# Patient Record
Sex: Female | Born: 1975 | Race: White | Hispanic: No | Marital: Married | Smoking: Former smoker
Health system: Southern US, Community
[De-identification: ages and names within clinical notes are randomized; demographics above are authoritative.]

## PROBLEM LIST (undated history)

## (undated) DIAGNOSIS — Z87442 Personal history of urinary calculi: Secondary | ICD-10-CM

## (undated) DIAGNOSIS — O09299 Supervision of pregnancy with other poor reproductive or obstetric history, unspecified trimester: Secondary | ICD-10-CM

## (undated) DIAGNOSIS — E039 Hypothyroidism, unspecified: Secondary | ICD-10-CM

## (undated) DIAGNOSIS — K219 Gastro-esophageal reflux disease without esophagitis: Secondary | ICD-10-CM

## (undated) DIAGNOSIS — D649 Anemia, unspecified: Secondary | ICD-10-CM

## (undated) DIAGNOSIS — R519 Headache, unspecified: Secondary | ICD-10-CM

## (undated) DIAGNOSIS — R51 Headache: Secondary | ICD-10-CM

## (undated) HISTORY — PX: WISDOM TOOTH EXTRACTION: SHX21

## (undated) HISTORY — DX: Supervision of pregnancy with other poor reproductive or obstetric history, unspecified trimester: O09.299

---

## 2002-01-09 ENCOUNTER — Encounter: Admission: RE | Admit: 2002-01-09 | Discharge: 2002-01-09 | Payer: Self-pay | Admitting: Internal Medicine

## 2002-01-09 ENCOUNTER — Encounter: Payer: Self-pay | Admitting: Internal Medicine

## 2005-01-01 ENCOUNTER — Encounter: Admission: RE | Admit: 2005-01-01 | Discharge: 2005-01-01 | Payer: Self-pay | Admitting: Internal Medicine

## 2010-04-01 ENCOUNTER — Ambulatory Visit (HOSPITAL_COMMUNITY)
Admission: RE | Admit: 2010-04-01 | Discharge: 2010-04-01 | Payer: Self-pay | Source: Home / Self Care | Admitting: Gynecology

## 2011-04-28 NOTE — L&D Delivery Note (Signed)
Delivery Note At 1:21 PM a viable female was delivered via OA Presentation APGAR: 8 and 9 Mild Shoulder dystocia noted but resolved with application of suprapubic pressure. Placenta status: spontaneously with 3 vessel cord  Cord:  with the following complications: none  Anesthesia: Epidural  Episiotomy: none Lacerations: second Suture Repair: 3.0 chromic Est. Blood Loss (mL): 300 Mom to postpartum.  Baby to nursery-stable.  Makenzie Vittorio L 03/09/2012, 1:35 PM

## 2011-08-26 LAB — OB RESULTS CONSOLE HEPATITIS B SURFACE ANTIGEN: Hepatitis B Surface Ag: NEGATIVE

## 2011-08-26 LAB — OB RESULTS CONSOLE HIV ANTIBODY (ROUTINE TESTING): HIV: NONREACTIVE

## 2011-08-26 LAB — OB RESULTS CONSOLE ABO/RH: RH Type: NEGATIVE

## 2011-08-26 LAB — OB RESULTS CONSOLE RPR: RPR: NONREACTIVE

## 2011-08-26 LAB — OB RESULTS CONSOLE RUBELLA ANTIBODY, IGM: Rubella: IMMUNE

## 2011-08-26 LAB — OB RESULTS CONSOLE ANTIBODY SCREEN: Antibody Screen: NEGATIVE

## 2012-02-17 LAB — OB RESULTS CONSOLE GBS: GBS: NEGATIVE

## 2012-03-08 ENCOUNTER — Inpatient Hospital Stay (HOSPITAL_COMMUNITY)
Admission: AD | Admit: 2012-03-08 | Discharge: 2012-03-11 | DRG: 774 | Disposition: A | Discharge status: Home / Self Care | Payer: 59 | Source: Ambulatory Visit | Attending: Obstetrics and Gynecology | Admitting: Obstetrics and Gynecology

## 2012-03-08 ENCOUNTER — Encounter (HOSPITAL_COMMUNITY): Payer: Self-pay

## 2012-03-08 DIAGNOSIS — IMO0002 Reserved for concepts with insufficient information to code with codable children: Secondary | ICD-10-CM | POA: Diagnosis present

## 2012-03-08 DIAGNOSIS — O09519 Supervision of elderly primigravida, unspecified trimester: Secondary | ICD-10-CM | POA: Diagnosis present

## 2012-03-08 HISTORY — DX: Hypothyroidism, unspecified: E03.9

## 2012-03-08 LAB — COMPREHENSIVE METABOLIC PANEL
ALT: 13 U/L (ref 0–35)
AST: 20 U/L (ref 0–37)
Alkaline Phosphatase: 159 U/L — ABNORMAL HIGH (ref 39–117)
CO2: 18 mEq/L — ABNORMAL LOW (ref 19–32)
Calcium: 9.1 mg/dL (ref 8.4–10.5)
Chloride: 102 mEq/L (ref 96–112)
GFR calc Af Amer: >90 mL/min (ref >90)
GFR calc non Af Amer: >90 mL/min (ref >90)
Glucose, Bld: 66 mg/dL — ABNORMAL LOW (ref 70–99)
Sodium: 133 mEq/L — ABNORMAL LOW (ref 135–145)
Total Bilirubin: 0.2 mg/dL — ABNORMAL LOW (ref 0.3–1.2)

## 2012-03-08 LAB — CBC
Hemoglobin: 12.6 g/dL (ref 12.0–15.0)
MCH: 30.4 pg (ref 26.0–34.0)
Platelets: 208 10*3/uL (ref 150–400)
RBC: 4.15 MIL/uL (ref 3.87–5.11)
WBC: 9.3 10*3/uL (ref 4.0–10.5)

## 2012-03-08 LAB — TYPE AND SCREEN: ABO/RH(D): A POS

## 2012-03-08 MED ORDER — OXYTOCIN BOLUS FROM INFUSION: 500.0000 mL | INTRAVENOUS | Status: DC

## 2012-03-08 MED ORDER — IBUPROFEN 600 MG PO TABS
600.0000 mg | ORAL_TABLET | Freq: Four times a day (QID) | ORAL | Status: DC | PRN
  Administered 2012-03-09: 600 mg via ORAL
  Filled 2012-03-08: qty 1

## 2012-03-08 MED ORDER — LIDOCAINE HCL (PF) 1 % IJ SOLN
30.0000 mL | INTRAMUSCULAR | Status: DC | PRN
  Administered 2012-03-09: 30 mL via SUBCUTANEOUS
  Filled 2012-03-08 (×2): qty 30

## 2012-03-08 MED ORDER — OXYCODONE-ACETAMINOPHEN 5-325 MG PO TABS: 1.0000 | ORAL_TABLET | ORAL | Status: DC | PRN

## 2012-03-08 MED ORDER — OXYTOCIN 40 UNITS IN LACTATED RINGERS INFUSION - SIMPLE MED
62.5000 mL/h | INTRAVENOUS | Status: DC
  Filled 2012-03-08: qty 1000

## 2012-03-08 MED ORDER — MAGNESIUM SULFATE 40 G IN LACTATED RINGERS - SIMPLE
2.0000 g/h | INTRAVENOUS | Status: DC
  Administered 2012-03-08: 2 g/h via INTRAVENOUS
  Filled 2012-03-08: qty 500

## 2012-03-08 MED ORDER — MISOPROSTOL 25 MCG QUARTER TABLET
25.0000 ug | ORAL_TABLET | ORAL | Status: DC | PRN
  Administered 2012-03-08: 25 ug via VAGINAL
  Filled 2012-03-08: qty 0.25

## 2012-03-08 MED ORDER — MAGNESIUM SULFATE BOLUS VIA INFUSION
4.0000 g | Freq: Once | INTRAVENOUS | Status: AC
  Administered 2012-03-08: 4 g via INTRAVENOUS
  Filled 2012-03-08: qty 500

## 2012-03-08 MED ORDER — LACTATED RINGERS IV SOLN
500.0000 mL | INTRAVENOUS | Status: DC | PRN
  Administered 2012-03-08: 500 mL via INTRAVENOUS
  Administered 2012-03-09: 250 mL via INTRAVENOUS
  Administered 2012-03-09: 500 mL via INTRAVENOUS

## 2012-03-08 MED ORDER — TERBUTALINE SULFATE 1 MG/ML IJ SOLN: 0.2500 mg | Freq: Once | INTRAMUSCULAR | Status: AC | PRN

## 2012-03-08 MED ORDER — CITRIC ACID-SODIUM CITRATE 334-500 MG/5ML PO SOLN: 30.0000 mL | ORAL | Status: DC | PRN

## 2012-03-08 MED ORDER — ONDANSETRON HCL 4 MG/2ML IJ SOLN: 4.0000 mg | Freq: Four times a day (QID) | INTRAMUSCULAR | Status: DC | PRN

## 2012-03-08 MED ORDER — LACTATED RINGERS IV SOLN
INTRAVENOUS | Status: DC
  Administered 2012-03-08 – 2012-03-09 (×4): via INTRAVENOUS

## 2012-03-08 MED ORDER — ACETAMINOPHEN 325 MG PO TABS: 650.0000 mg | ORAL_TABLET | ORAL | Status: DC | PRN

## 2012-03-08 NOTE — H&P (Signed)
Marissa [name] is a 36 y.o. female presenting for induction of labor. Has had increasing HA and vision change over several days. Today in office Dr Rana Snare noted BPs dias upper 90s with proteinuria and noted these symptoms. Sent to L&D for induction. IVF pregnancy. Hypothyroid. History OB History    Grav Para Term Preterm Abortions TAB SAB Ect Mult Living   1              No past medical history on file. No past surgical history on file. Family History: family history is not on file. Social History:  reports that she has quit smoking. She has never used smokeless tobacco. Her alcohol and drug histories not on file.   Prenatal Transfer Tool  Maternal Diabetes: No Genetic Screening: Normal Maternal Ultrasounds/Referrals: Normal Fetal Ultrasounds or other Referrals:  None Maternal Substance Abuse:  No Significant Maternal Medications:  Meds include: Syntroid Significant Maternal Lab Results:  None Other Comments:  None  Review of Systems  HENT:       Increasing HA over past several days and earlier today None now  Eyes: Positive for blurred vision.       C/O vision changes on/off  Gastrointestinal: Negative for abdominal pain.  Neurological: Positive for headaches.    Dilation: Closed Effacement (%): Thick Station: -3 Exam by:: Dr. Henderson Cloud Blood pressure 143/96, pulse 85, height 5\' 7"  (1.702 m), weight 190 lb (86.183 kg). Maternal Exam:  Uterine Assessment: Contraction strength is mild.  Contraction frequency is regular.      Physical Exam  Cardiovascular: Normal rate and regular rhythm.   Respiratory: Effort normal and breath sounds normal.  GI: There is no tenderness.  Neurological:       DTR 3 + with out clonus   vtx to exam Prenatal labs: ABO, Rh: A/Negative/-- (05/01 0000) Antibody: Negative (05/01 0000) Rubella: Immune (05/01 0000) RPR: Nonreactive (05/01 0000)  HBsAg: Negative (05/01 0000)  HIV: Non-reactive (05/01 0000)  GBS: Negative (10/23 0000)    Assessment/Plan: 36 yo G1P0 with preeclampsia on basis of elevated BPs, proteinuria and CNS changes. D/W patient and husband Check labs Begin magnesium sulfate Two stage induction-risks reviewed   Marissa [name],Marissa [name] 03/08/2012, 8:34 PM

## 2012-03-09 ENCOUNTER — Encounter (HOSPITAL_COMMUNITY): Payer: Self-pay | Admitting: Anesthesiology

## 2012-03-09 ENCOUNTER — Inpatient Hospital Stay (HOSPITAL_COMMUNITY): Payer: 59 | Admitting: Anesthesiology

## 2012-03-09 ENCOUNTER — Encounter (HOSPITAL_COMMUNITY): Payer: Self-pay | Admitting: *Deleted

## 2012-03-09 LAB — CBC
HCT: 31.9 % — ABNORMAL LOW (ref 36.0–46.0)
Platelets: 218 10*3/uL (ref 150–400)
RBC: 3.68 MIL/uL — ABNORMAL LOW (ref 3.87–5.11)
RDW: 13.9 % (ref 11.5–15.5)
RDW: 13.8 % (ref 11.5–15.5)
WBC: 11.1 10*3/uL — ABNORMAL HIGH (ref 4.0–10.5)
WBC: 19.8 10*3/uL — ABNORMAL HIGH (ref 4.0–10.5)

## 2012-03-09 MED ORDER — LACTATED RINGERS IV SOLN
500.0000 mL | Freq: Once | INTRAVENOUS | Status: AC
  Administered 2012-03-09: 500 mL via INTRAVENOUS

## 2012-03-09 MED ORDER — PHENYLEPHRINE 40 MCG/ML (10ML) SYRINGE FOR IV PUSH (FOR BLOOD PRESSURE SUPPORT)
80.0000 ug | PREFILLED_SYRINGE | INTRAVENOUS | Status: DC | PRN
  Filled 2012-03-09: qty 5

## 2012-03-09 MED ORDER — TETANUS-DIPHTH-ACELL PERTUSSIS 5-2.5-18.5 LF-MCG/0.5 IM SUSP
0.5000 mL | Freq: Once | INTRAMUSCULAR | Status: DC
  Filled 2012-03-09: qty 0.5

## 2012-03-09 MED ORDER — FENTANYL 2.5 MCG/ML BUPIVACAINE 1/10 % EPIDURAL INFUSION (WH - ANES)
14.0000 mL/h | INTRAMUSCULAR | Status: DC
  Administered 2012-03-09: 14 mL/h via EPIDURAL
  Filled 2012-03-09 (×2): qty 125

## 2012-03-09 MED ORDER — SIMETHICONE 80 MG PO CHEW
80.0000 mg | CHEWABLE_TABLET | ORAL | Status: DC | PRN
  Administered 2012-03-10: 80 mg via ORAL

## 2012-03-09 MED ORDER — DIPHENHYDRAMINE HCL 25 MG PO CAPS: 25.0000 mg | ORAL_CAPSULE | Freq: Four times a day (QID) | ORAL | Status: DC | PRN

## 2012-03-09 MED ORDER — BUTORPHANOL TARTRATE 1 MG/ML IJ SOLN
1.0000 mg | INTRAMUSCULAR | Status: DC | PRN
  Administered 2012-03-09: 1 mg via INTRAVENOUS
  Filled 2012-03-09: qty 1

## 2012-03-09 MED ORDER — PHENYLEPHRINE 40 MCG/ML (10ML) SYRINGE FOR IV PUSH (FOR BLOOD PRESSURE SUPPORT): 80.0000 ug | PREFILLED_SYRINGE | INTRAVENOUS | Status: DC | PRN

## 2012-03-09 MED ORDER — ZOLPIDEM TARTRATE 5 MG PO TABS: 5.0000 mg | ORAL_TABLET | Freq: Every evening | ORAL | Status: DC | PRN

## 2012-03-09 MED ORDER — BUPIVACAINE HCL (PF) 0.25 % IJ SOLN
INTRAMUSCULAR | Status: DC | PRN
  Administered 2012-03-09 (×2): 5 mL

## 2012-03-09 MED ORDER — FLEET ENEMA 7-19 GM/118ML RE ENEM: 1.0000 | ENEMA | Freq: Every day | RECTAL | Status: DC | PRN

## 2012-03-09 MED ORDER — SENNOSIDES-DOCUSATE SODIUM 8.6-50 MG PO TABS
2.0000 | ORAL_TABLET | Freq: Every day | ORAL | Status: DC
  Administered 2012-03-09 – 2012-03-10 (×2): 2 via ORAL

## 2012-03-09 MED ORDER — LEVOTHYROXINE SODIUM 100 MCG PO TABS
100.0000 ug | ORAL_TABLET | Freq: Every day | ORAL | Status: DC
  Administered 2012-03-10 – 2012-03-11 (×2): 100 ug via ORAL
  Filled 2012-03-09 (×2): qty 1

## 2012-03-09 MED ORDER — DIBUCAINE 1 % RE OINT
1.0000 "application " | TOPICAL_OINTMENT | RECTAL | Status: DC | PRN
  Administered 2012-03-10: 1 via RECTAL
  Filled 2012-03-09: qty 28

## 2012-03-09 MED ORDER — MAGNESIUM SULFATE 40 G IN LACTATED RINGERS - SIMPLE
2.0000 g/h | INTRAVENOUS | Status: DC
  Administered 2012-03-09: 2 g/h via INTRAVENOUS
  Filled 2012-03-09 (×2): qty 500

## 2012-03-09 MED ORDER — IBUPROFEN 600 MG PO TABS
600.0000 mg | ORAL_TABLET | Freq: Four times a day (QID) | ORAL | Status: DC
  Administered 2012-03-09 – 2012-03-11 (×7): 600 mg via ORAL
  Filled 2012-03-09 (×7): qty 1

## 2012-03-09 MED ORDER — ONDANSETRON HCL 4 MG/2ML IJ SOLN: 4.0000 mg | INTRAMUSCULAR | Status: DC | PRN

## 2012-03-09 MED ORDER — LACTATED RINGERS IV SOLN
INTRAVENOUS | Status: DC
  Administered 2012-03-10: 01:00:00 via INTRAVENOUS

## 2012-03-09 MED ORDER — BENZOCAINE-MENTHOL 20-0.5 % EX AERO
1.0000 "application " | INHALATION_SPRAY | CUTANEOUS | Status: DC | PRN
  Filled 2012-03-09 (×2): qty 56

## 2012-03-09 MED ORDER — ZOLPIDEM TARTRATE 5 MG PO TABS
5.0000 mg | ORAL_TABLET | Freq: Every evening | ORAL | Status: DC | PRN
  Administered 2012-03-09: 5 mg via ORAL
  Filled 2012-03-09: qty 1

## 2012-03-09 MED ORDER — ONDANSETRON HCL 4 MG PO TABS: 4.0000 mg | ORAL_TABLET | ORAL | Status: DC | PRN

## 2012-03-09 MED ORDER — DIPHENHYDRAMINE HCL 50 MG/ML IJ SOLN: 12.5000 mg | INTRAMUSCULAR | Status: DC | PRN

## 2012-03-09 MED ORDER — WITCH HAZEL-GLYCERIN EX PADS
1.0000 "application " | MEDICATED_PAD | CUTANEOUS | Status: DC | PRN
  Administered 2012-03-10: 1 via TOPICAL

## 2012-03-09 MED ORDER — SODIUM BICARBONATE 8.4 % IV SOLN
INTRAVENOUS | Status: DC | PRN
  Administered 2012-03-09: 5 mL via EPIDURAL

## 2012-03-09 MED ORDER — TERBUTALINE SULFATE 1 MG/ML IJ SOLN: 0.2500 mg | Freq: Once | INTRAMUSCULAR | Status: DC | PRN

## 2012-03-09 MED ORDER — MEDROXYPROGESTERONE ACETATE 150 MG/ML IM SUSP: 150.0000 mg | INTRAMUSCULAR | Status: DC | PRN

## 2012-03-09 MED ORDER — BISACODYL 10 MG RE SUPP: 10.0000 mg | Freq: Every day | RECTAL | Status: DC | PRN

## 2012-03-09 MED ORDER — OXYCODONE-ACETAMINOPHEN 5-325 MG PO TABS
1.0000 | ORAL_TABLET | ORAL | Status: DC | PRN
Start: 2012-03-09 — End: 2012-03-11

## 2012-03-09 MED ORDER — EPHEDRINE 5 MG/ML INJ
10.0000 mg | INTRAVENOUS | Status: AC | PRN
  Administered 2012-03-09 (×2): 10 mg via INTRAVENOUS
  Filled 2012-03-09 (×2): qty 4

## 2012-03-09 MED ORDER — FENTANYL 2.5 MCG/ML BUPIVACAINE 1/10 % EPIDURAL INFUSION (WH - ANES)
INTRAMUSCULAR | Status: DC | PRN
  Administered 2012-03-09: 14 mL/h via EPIDURAL

## 2012-03-09 MED ORDER — EPHEDRINE 5 MG/ML INJ
10.0000 mg | INTRAVENOUS | Status: DC | PRN
  Administered 2012-03-09: 10 mg via INTRAVENOUS

## 2012-03-09 MED ORDER — OXYTOCIN 40 UNITS IN LACTATED RINGERS INFUSION - SIMPLE MED
1.0000 m[IU]/min | INTRAVENOUS | Status: DC
  Administered 2012-03-09: 2 m[IU]/min via INTRAVENOUS

## 2012-03-09 MED ORDER — PRENATAL MULTIVITAMIN CH
1.0000 | ORAL_TABLET | Freq: Every day | ORAL | Status: DC
  Administered 2012-03-09 – 2012-03-11 (×3): 1 via ORAL
  Filled 2012-03-09 (×3): qty 1

## 2012-03-09 MED ORDER — LANOLIN HYDROUS EX OINT: TOPICAL_OINTMENT | CUTANEOUS | Status: DC | PRN

## 2012-03-09 MED ORDER — MEASLES, MUMPS & RUBELLA VAC ~~LOC~~ INJ: 0.5000 mL | INJECTION | Freq: Once | SUBCUTANEOUS | Status: DC

## 2012-03-09 NOTE — Anesthesia Procedure Notes (Signed)

## 2012-03-09 NOTE — Anesthesia Preprocedure Evaluation (Signed)

## 2012-03-10 LAB — COMPREHENSIVE METABOLIC PANEL
ALT: 16 U/L (ref 0–35)
AST: 26 U/L (ref 0–37)
CO2: 24 mEq/L (ref 19–32)
Chloride: 104 mEq/L (ref 96–112)
GFR calc non Af Amer: >90 mL/min (ref >90)
Glucose, Bld: 106 mg/dL — ABNORMAL HIGH (ref 70–99)
Sodium: 135 mEq/L (ref 135–145)
Total Bilirubin: 0.1 mg/dL — ABNORMAL LOW (ref 0.3–1.2)

## 2012-03-10 LAB — CBC
Hemoglobin: 9.2 g/dL — ABNORMAL LOW (ref 12.0–15.0)
MCV: 86.9 fL (ref 78.0–100.0)
Platelets: 169 10*3/uL (ref 150–400)
RBC: 3.06 MIL/uL — ABNORMAL LOW (ref 3.87–5.11)
WBC: 13.8 10*3/uL — ABNORMAL HIGH (ref 4.0–10.5)

## 2012-03-10 MED ORDER — SODIUM CHLORIDE 0.9 % IJ SOLN
3.0000 mL | Freq: Two times a day (BID) | INTRAMUSCULAR | Status: DC
  Administered 2012-03-10: 3 mL via INTRAVENOUS

## 2012-03-10 MED ORDER — SODIUM CHLORIDE 0.9 % IJ SOLN: 3.0000 mL | INTRAMUSCULAR | Status: DC | PRN

## 2012-03-10 NOTE — Progress Notes (Addendum)
1430 Francesca Jewett, RN to give report for transfer - RN will call back. 1515 SBAR report called to Vernona Rieger, RN for pt transfer to [facility] Rm#[id number] Called Vernona Rieger back to let her know that the pt asked about possibly going home tonight. Pt transferred via w/c to RM #[id number]

## 2012-03-10 NOTE — Anesthesia Postprocedure Evaluation (Signed)
  Anesthesia Post-op Note  Patient: Marissa [name]  Procedure(s) Performed: * No procedures listed *  Patient Location: A-ICU  Anesthesia Type:Epidural  Level of Consciousness: awake, alert  and oriented  Airway and Oxygen Therapy: Patient Spontanous Breathing  Post-op Pain: none  Post-op Assessment: Patient's Cardiovascular Status Stable, Respiratory Function Stable, Patent Airway, No signs of Nausea or vomiting, NAUSEA AND VOMITING PRESENT, Adequate PO intake, Pain level controlled, No headache, No backache, No residual numbness and No residual motor weakness  Post-op Vital Signs: Reviewed and stable  Complications: No apparent anesthesia complications

## 2012-03-10 NOTE — Progress Notes (Signed)
Dr. Rana Snare contacted per request of Pt. Inquiring if she could be d/c tonight. Dr. Rana Snare stated No she had to stay until tomorrow.

## 2012-03-10 NOTE — Progress Notes (Signed)
UR chart review completed.  

## 2012-03-10 NOTE — Progress Notes (Signed)
Post Partum Day 1 Subjective: no complaints, up ad lib, voiding and tolerating PO  Objective: Blood pressure 100/66, pulse 90, temperature 97.8 F (36.6 C), temperature source Oral, resp. rate 18, height 5\' 7"  (1.702 m), weight 98.748 kg (217 lb 11.2 oz), SpO2 99.00%, unknown if currently breastfeeding.  Physical Exam:  General: alert, cooperative, appears stated age and no distress Lochia: appropriate Uterine Fundus: firm Incision:  DVT Evaluation: No evidence of DVT seen on physical exam.   Basename 03/10/12 0516 03/09/12 1616  HGB 9.2* 11.0*  HCT 26.6* 31.9*    Assessment/Plan: Plan for discharge tomorrow PP Preeclampsia now begininng diuresis Labs normal Bps stable Dc Mag and transfer to floor   LOS: 2 days   Selso Mannor C 03/10/2012, 9:12 AM

## 2012-03-11 MED ORDER — HYDROCORTISONE ACE-PRAMOXINE 1-1 % RE FOAM: 1.0000 | Freq: Two times a day (BID) | RECTAL | Status: DC

## 2012-03-11 MED ORDER — IBUPROFEN 600 MG PO TABS: 600.0000 mg | ORAL_TABLET | Freq: Four times a day (QID) | ORAL | Status: DC

## 2012-03-11 NOTE — Progress Notes (Cosign Needed)
Post Partum Day 2 Subjective: no complaints, up ad lib, voiding, tolerating PO, + flatus and denies HA, blurred vision and RUQ pain  Objective: Blood pressure 128/85, pulse 64, temperature 98.3 F (36.8 C), temperature source Oral, resp. rate 20, height 5\' 7"  (1.702 m), weight 98.748 kg (217 lb 11.2 oz), SpO2 97.00%, unknown if currently breastfeeding.  Physical Exam:  General: alert and cooperative Lochia: appropriate Uterine Fundus: firm Incision: perineum intact ,small hemorrhoid DVT Evaluation: No evidence of DVT seen on physical exam. Negative Homan's sign. No cords or calf tenderness. 2+ edema bilateral LE DTR's  2+   Basename 03/10/12 0516 03/09/12 1616  HGB 9.2* 11.0*  HCT 26.6* 31.9*    Assessment/Plan: Discharge home  Discussed [facility] ,patient and spouse are RN's will  monitor BP at home    LOS: 3 days   Chae Shuster G 03/11/2012, 8:39 AM

## 2012-03-11 NOTE — Discharge Summary (Signed)
Obstetric Discharge Summary Reason for Admission: induction of labor Prenatal Procedures: ultrasound Intrapartum Procedures: spontaneous vaginal delivery Postpartum Procedures: none Complications-Operative and Postpartum: 2 degree perineal laceration Hemoglobin  Date Value Range Status  03/10/2012 9.2* 12.0 - 15.0 g/dL Final     HCT  Date Value Range Status  03/10/2012 26.6* 36.0 - 46.0 % Final    Physical Exam:  General: alert and cooperative Lochia: appropriate Uterine Fundus: firm Incision: perineum intact, small hemorrhoid noted DVT Evaluation: No evidence of DVT seen on physical exam. Calf/Ankle edema is present. DTR's 2+ no clonus  Discharge Diagnoses: Term Pregnancy-delivered and Preelampsia  Discharge Information: Date: 03/11/2012 Activity: pelvic rest Diet: routine Medications: PNV, Ibuprofen and synthroid Condition: stable Instructions: refer to practice specific booklet Discharge to: home   Newborn Data: Live born female  Birth Weight: 7 lb 12.2 oz (3520 g) APGAR: 8, 9  Home with mother.  Marissa [name] G 03/11/2012, 8:44 AM

## 2012-03-12 ENCOUNTER — Ambulatory Visit (HOSPITAL_COMMUNITY)
Admission: RE | Admit: 2012-03-12 | Discharge: 2012-03-12 | Disposition: A | Discharge status: Home / Self Care | Payer: 59 | Source: Ambulatory Visit | Attending: Internal Medicine | Admitting: Internal Medicine

## 2012-03-12 NOTE — Progress Notes (Signed)
Adult Lactation Consultation Outpatient Visit Note  Patient Name: Marissa [name] Date of Birth: Jul 03, 1975 Gestational Age at Delivery: [redacted]w[redacted]d Type of Delivery:   [name]) born 03/09/12, 7lbs, 12oz at birth, now 7lbs 6oz at discharge per mom.  Mom walked in asking for appointment. Parents concerned about baby's feeding habits, she cluster fed off and on overnight. Mom has sore nipples and wanted to know if baby was getting anything at the breast. Baby has dry lips, but moist mucous membranes, skin supple, no signs of sunken fontanels.   Breastfeeding History: Frequency of Breastfeeding: Every 2hrs since 2:30am. Length of Feeding: 20-95min Voids: 2 in past 24hrs Stools: 6 in past 24hrs  Supplementing / Method: Pumping: none  Type of Pump:   Frequency:  Volume:     Consultation Evaluation:  Woke baby, she began showing hunger cues. Instructed mom to put baby to the breast skin to skin. Mom latched baby in cross cradle, but Marissa [name] was facing upward and latching to the end of mom's nipples. Mom has large, long, everted nipples without outward signs of damage, but some redness. Her breasts are starting to fill, no signs of engorgement. Assisted with positioning and depth. Showed her how to adjust the baby's jaw for depth and comfort. Marissa [name] immediately began swallowing audibly (confirmed with stethoscope placement). Baby fed in a consistent pattern for before unlatching herself and falling into a deep sleep. Reassured mom that when baby is latched deeply, she is able  Instructed mom to keep feeding with hunger cues, watch for adequate voids and stools, increase her own calorie/fluid intake and call back on Monday for another outpatient appointment if she is still concerned. Encouraged mom to call for [facility] assistance as needed and reviewed our outpatient services.   Follow-Up Mom to call for follow up as needed.     Edd Arbour R 03/12/2012, 1:17 PM

## 2014-02-26 ENCOUNTER — Encounter (HOSPITAL_COMMUNITY): Payer: Self-pay | Admitting: *Deleted

## 2015-07-02 MED FILL — SYNTHROID 100 MCG TABLET: 100 | 90 days supply | Qty: 90 | Fill #0

## 2015-07-08 DIAGNOSIS — E039 Hypothyroidism, unspecified: Secondary | ICD-10-CM | POA: Diagnosis not present

## 2015-07-08 DIAGNOSIS — J329 Chronic sinusitis, unspecified: Secondary | ICD-10-CM | POA: Diagnosis not present

## 2015-07-08 MED FILL — AMOX TR-K CLV 875-125 MG TA: 875-125 | 10 days supply | Qty: 20 | Fill #0

## 2015-08-08 ENCOUNTER — Encounter (HOSPITAL_COMMUNITY): Payer: Self-pay

## 2015-08-08 ENCOUNTER — Emergency Department (HOSPITAL_COMMUNITY)
Admission: EM | Admit: 2015-08-08 | Discharge: 2015-08-08 | Disposition: A | Discharge status: Home / Self Care | Payer: 59 | Attending: Emergency Medicine | Admitting: Emergency Medicine

## 2015-08-08 ENCOUNTER — Emergency Department (HOSPITAL_COMMUNITY): Payer: 59

## 2015-08-08 DIAGNOSIS — R112 Nausea with vomiting, unspecified: Secondary | ICD-10-CM | POA: Insufficient documentation

## 2015-08-08 DIAGNOSIS — Z3202 Encounter for pregnancy test, result negative: Secondary | ICD-10-CM | POA: Insufficient documentation

## 2015-08-08 DIAGNOSIS — Z79899 Other long term (current) drug therapy: Secondary | ICD-10-CM | POA: Diagnosis not present

## 2015-08-08 DIAGNOSIS — E039 Hypothyroidism, unspecified: Secondary | ICD-10-CM | POA: Insufficient documentation

## 2015-08-08 DIAGNOSIS — R1030 Lower abdominal pain, unspecified: Secondary | ICD-10-CM | POA: Diagnosis not present

## 2015-08-08 DIAGNOSIS — R1011 Right upper quadrant pain: Secondary | ICD-10-CM | POA: Insufficient documentation

## 2015-08-08 DIAGNOSIS — R1013 Epigastric pain: Secondary | ICD-10-CM | POA: Insufficient documentation

## 2015-08-08 DIAGNOSIS — R197 Diarrhea, unspecified: Secondary | ICD-10-CM | POA: Diagnosis not present

## 2015-08-08 DIAGNOSIS — Z87891 Personal history of nicotine dependence: Secondary | ICD-10-CM | POA: Insufficient documentation

## 2015-08-08 LAB — COMPREHENSIVE METABOLIC PANEL
ALBUMIN: 3.9 g/dL (ref 3.5–5.0)
ALK PHOS: 51 U/L (ref 38–126)
ALT: 11 U/L — ABNORMAL LOW (ref 14–54)
ANION GAP: 11 (ref 5–15)
AST: 17 U/L (ref 15–41)
BUN: 9 mg/dL (ref 6–20)
CALCIUM: 9 mg/dL (ref 8.9–10.3)
CO2: 25 mmol/L (ref 22–32)
Chloride: 102 mmol/L (ref 101–111)
Creatinine, Ser: 1.06 mg/dL — ABNORMAL HIGH (ref 0.44–1.00)
GFR calc Af Amer: >60 mL/min (ref >60)
GFR calc non Af Amer: >60 mL/min (ref >60)
GLUCOSE: 110 mg/dL — AB (ref 65–99)
POTASSIUM: 3.8 mmol/L (ref 3.5–5.1)
SODIUM: 138 mmol/L (ref 135–145)
Total Bilirubin: 0.7 mg/dL (ref 0.3–1.2)
Total Protein: 6.8 g/dL (ref 6.5–8.1)

## 2015-08-08 LAB — CBC
HEMATOCRIT: 42.2 % (ref 36.0–46.0)
HEMOGLOBIN: 14 g/dL (ref 12.0–15.0)
MCH: 28 pg (ref 26.0–34.0)
MCHC: 33.2 g/dL (ref 30.0–36.0)
MCV: 84.4 fL (ref 78.0–100.0)
Platelets: 190 10*3/uL (ref 150–400)
RBC: 5 MIL/uL (ref 3.87–5.11)
RDW: 12.9 % (ref 11.5–15.5)
WBC: 5.3 10*3/uL (ref 4.0–10.5)

## 2015-08-08 LAB — URINALYSIS, ROUTINE W REFLEX MICROSCOPIC
BILIRUBIN URINE: NEGATIVE
GLUCOSE, UA: NEGATIVE mg/dL
HGB URINE DIPSTICK: NEGATIVE
Ketones, ur: NEGATIVE mg/dL
Leukocytes, UA: NEGATIVE
Nitrite: NEGATIVE
PH: 5.5 (ref 5.0–8.0)
Protein, ur: NEGATIVE mg/dL
SPECIFIC GRAVITY, URINE: 1.024 (ref 1.005–1.030)

## 2015-08-08 LAB — I-STAT BETA HCG BLOOD, ED (MC, WL, AP ONLY): I-stat hCG, quantitative: <5 m[IU]/mL (ref <5)

## 2015-08-08 LAB — LIPASE, BLOOD: Lipase: 28 U/L (ref 11–51)

## 2015-08-08 MED ORDER — MORPHINE SULFATE (PF) 4 MG/ML IV SOLN
4.0000 mg | Freq: Once | INTRAVENOUS | Status: AC
  Administered 2015-08-08: 4 mg via INTRAVENOUS
  Filled 2015-08-08: qty 1

## 2015-08-08 MED ORDER — PANTOPRAZOLE SODIUM 20 MG PO TBEC: 20.0000 mg | DELAYED_RELEASE_TABLET | Freq: Every day | ORAL | Status: DC

## 2015-08-08 MED ORDER — ONDANSETRON 4 MG PO TBDP: 4.0000 mg | ORAL_TABLET | Freq: Three times a day (TID) | ORAL | Status: DC | PRN

## 2015-08-08 MED ORDER — ONDANSETRON HCL 4 MG/2ML IJ SOLN
4.0000 mg | Freq: Once | INTRAMUSCULAR | Status: AC
  Administered 2015-08-08: 4 mg via INTRAVENOUS
  Filled 2015-08-08: qty 2

## 2015-08-08 MED ORDER — GI COCKTAIL ~~LOC~~
30.0000 mL | Freq: Once | ORAL | Status: AC
  Administered 2015-08-08: 30 mL via ORAL
  Filled 2015-08-08: qty 30

## 2015-08-08 MED ORDER — SODIUM CHLORIDE 0.9 % IV BOLUS (SEPSIS)
1000.0000 mL | Freq: Once | INTRAVENOUS | Status: AC
  Administered 2015-08-08: 1000 mL via INTRAVENOUS

## 2015-08-08 NOTE — Discharge Instructions (Signed)
Viral Gastroenteritis  Viral gastroenteritis is also known as stomach flu. This condition affects the stomach and intestinal tract. It can cause sudden diarrhea and vomiting. The illness typically lasts 3 to 8 days. Most people develop an immune response that eventually gets rid of the virus. While this natural response develops, the virus can make you quite ill. CAUSES  Many different viruses can cause gastroenteritis, such as rotavirus or noroviruses. You can catch one of these viruses by consuming contaminated food or water. You may also catch a virus by sharing utensils or other personal items with an infected person or by touching a contaminated surface. SYMPTOMS  The most common symptoms are diarrhea and vomiting. These problems can cause a severe loss of body fluids (dehydration) and a body salt (electrolyte) imbalance. Other symptoms may include:  Fever.  Headache.  Fatigue.  Abdominal pain. DIAGNOSIS  Your caregiver can usually diagnose viral gastroenteritis based on your symptoms and a physical exam. A stool sample may also be taken to test for the presence of viruses or other infections. TREATMENT  This illness typically goes away on its own. Treatments are aimed at rehydration. The most serious cases of viral gastroenteritis involve vomiting so severely that you are not able to keep fluids down. In these cases, fluids must be given through an intravenous line (IV). HOME CARE INSTRUCTIONS   Drink enough fluids to keep your urine clear or pale yellow. Drink small amounts of fluids frequently and increase the amounts as tolerated.  Ask your caregiver for specific rehydration instructions.  Avoid:  Foods high in sugar.  Alcohol.  Carbonated drinks.  Tobacco.  Juice.  Caffeine drinks.  Extremely hot or cold fluids.  Fatty, greasy foods.  Too much intake of anything at one time.  Dairy products until 24 to 48 hours after diarrhea stops.  You may consume  probiotics. Probiotics are active cultures of beneficial bacteria. They may lessen the amount and number of diarrheal stools in adults. Probiotics can be found in yogurt with active cultures and in supplements.  Wash your hands well to avoid spreading the virus.  Only take over-the-counter or prescription medicines for pain, discomfort, or fever as directed by your caregiver. Do not give aspirin to children. Antidiarrheal medicines are not recommended.  Ask your caregiver if you should continue to take your regular prescribed and over-the-counter medicines.  Keep all follow-up appointments as directed by your caregiver. SEEK IMMEDIATE MEDICAL CARE IF:   You are unable to keep fluids down.  You do not urinate at least once every 6 to 8 hours.  You develop shortness of breath.  You notice blood in your stool or vomit. This may look like coffee grounds.  You have abdominal pain that increases or is concentrated in one small area (localized).  You have persistent vomiting or diarrhea.  You have a fever.  The patient is a child younger than 3 months, and he or she has a fever.  The patient is a child older than 3 months, and he or she has a fever and persistent symptoms.  The patient is a child older than 3 months, and he or she has a fever and symptoms suddenly get worse.  The patient is a baby, and he or she has no tears when crying. MAKE SURE YOU:   Understand these instructions.  Will watch your condition.  Will get help right away if you are not doing well or get worse.   This information is not intended to  replace advice given to you by your health care provider. Make sure you discuss any questions you have with your health care provider.   Document Released: 04/13/2005 Document Revised: 07/06/2011 Document Reviewed: 01/28/2011 Elsevier Interactive Patient Education 2016 Elsevier Inc.   Abdominal Pain, Adult Many things can cause abdominal pain. Usually, abdominal  pain is not caused by a disease and will improve without treatment. It can often be observed and treated at home. Your health care provider will do a physical exam and possibly order blood tests and X-rays to help determine the seriousness of your pain. However, in many cases, more time must pass before a clear cause of the pain can be found. Before that point, your health care provider may not know if you need more testing or further treatment. HOME CARE INSTRUCTIONS Monitor your abdominal pain for any changes. The following actions may help to alleviate any discomfort you are experiencing:  Only take over-the-counter or prescription medicines as directed by your health care provider.  Do not take laxatives unless directed to do so by your health care provider.  Try a clear liquid diet (broth, tea, or water) as directed by your health care provider. Slowly move to a bland diet as tolerated. SEEK MEDICAL CARE IF:  You have unexplained abdominal pain.  You have abdominal pain associated with nausea or diarrhea.  You have pain when you urinate or have a bowel movement.  You experience abdominal pain that wakes you in the night.  You have abdominal pain that is worsened or improved by eating food.  You have abdominal pain that is worsened with eating fatty foods.  You have a fever. SEEK IMMEDIATE MEDICAL CARE IF:  Your pain does not go away within 2 hours.  You keep throwing up (vomiting).  Your pain is felt only in portions of the abdomen, such as the right side or the left lower portion of the abdomen.  You pass bloody or black tarry stools. MAKE SURE YOU:  Understand these instructions.  Will watch your condition.  Will get help right away if you are not doing well or get worse.   This information is not intended to replace advice given to you by your health care provider. Make sure you discuss any questions you have with your health care provider.   Document Released:  01/21/2005 Document Revised: 01/02/2015 Document Reviewed: 12/21/2012 Elsevier Interactive Patient Education 2016 Elsevier Inc.  Nausea and Vomiting Nausea is a sick feeling that often comes before throwing up (vomiting). Vomiting is a reflex where stomach contents come out of your mouth. Vomiting can cause severe loss of body fluids (dehydration). Children and elderly adults can become dehydrated quickly, especially if they also have diarrhea. Nausea and vomiting are symptoms of a condition or disease. It is important to find the cause of your symptoms. CAUSES   Direct irritation of the stomach lining. This irritation can result from increased acid production (gastroesophageal reflux disease), infection, food poisoning, taking certain medicines (such as nonsteroidal anti-inflammatory drugs), alcohol use, or tobacco use.  Signals from the brain.These signals could be caused by a headache, heat exposure, an inner ear disturbance, increased pressure in the brain from injury, infection, a tumor, or a concussion, pain, emotional stimulus, or metabolic problems.  An obstruction in the gastrointestinal tract (bowel obstruction).  Illnesses such as diabetes, hepatitis, gallbladder problems, appendicitis, kidney problems, cancer, sepsis, atypical symptoms of a heart attack, or eating disorders.  Medical treatments such as chemotherapy and radiation.  Receiving  medicine that makes you sleep (general anesthetic) during surgery. DIAGNOSIS Your caregiver may ask for tests to be done if the problems do not improve after a few days. Tests may also be done if symptoms are severe or if the reason for the nausea and vomiting is not clear. Tests may include:  Urine tests.  Blood tests.  Stool tests.  Cultures (to look for evidence of infection).  X-rays or other imaging studies. Test results can help your caregiver make decisions about treatment or the need for additional tests. TREATMENT You need to  stay well hydrated. Drink frequently but in small amounts.You may wish to drink water, sports drinks, clear broth, or eat frozen ice pops or gelatin dessert to help stay hydrated.When you eat, eating slowly may help prevent nausea.There are also some antinausea medicines that may help prevent nausea. HOME CARE INSTRUCTIONS   Take all medicine as directed by your caregiver.  If you do not have an appetite, do not force yourself to eat. However, you must continue to drink fluids.  If you have an appetite, eat a normal diet unless your caregiver tells you differently.  Eat a variety of complex carbohydrates (rice, wheat, potatoes, bread), lean meats, yogurt, fruits, and vegetables.  Avoid high-fat foods because they are more difficult to digest.  Drink enough water and fluids to keep your urine clear or pale yellow.  If you are dehydrated, ask your caregiver for specific rehydration instructions. Signs of dehydration may include:  Severe thirst.  Dry lips and mouth.  Dizziness.  Dark urine.  Decreasing urine frequency and amount.  Confusion.  Rapid breathing or pulse. SEEK IMMEDIATE MEDICAL CARE IF:   You have blood or brown flecks (like coffee grounds) in your vomit.  You have black or bloody stools.  You have a severe headache or stiff neck.  You are confused.  You have severe abdominal pain.  You have chest pain or trouble breathing.  You do not urinate at least once every 8 hours.  You develop cold or clammy skin.  You continue to vomit for longer than 24 to 48 hours.  You have a fever. MAKE SURE YOU:   Understand these instructions.  Will watch your condition.  Will get help right away if you are not doing well or get worse.   This information is not intended to replace advice given to you by your health care provider. Make sure you discuss any questions you have with your health care provider.   Document Released: 04/13/2005 Document Revised:  07/06/2011 Document Reviewed: 09/10/2010 Elsevier Interactive Patient Education 2016 Elsevier Inc.  Diarrhea Diarrhea is watery poop (stool). It can make you feel weak, tired, thirsty, or give you a dry mouth (signs of dehydration). Watery poop is a sign of another problem, most often an infection. It often lasts 2-3 days. It can last longer if it is a sign of something serious. Take care of yourself as told by your doctor. HOME CARE   Drink 1 cup (8 ounces) of fluid each time you have watery poop.  Do not drink the following fluids:  Those that contain simple sugars (fructose, glucose, galactose, lactose, sucrose, maltose).  Sports drinks.  Fruit juices.  Whole milk products.  Sodas.  Drinks with caffeine (coffee, tea, soda) or alcohol.  Oral rehydration solution may be used if the doctor says it is okay. You may make your own solution. Follow this recipe:   - teaspoon table salt.   teaspoon baking soda.  teaspoon salt substitute containing potassium chloride.  1 tablespoons sugar.  1 liter (34 ounces) of water.  Avoid the following foods:  High fiber foods, such as raw fruits and vegetables.  Nuts, seeds, and whole grain breads and cereals.   Those that are sweetened with sugar alcohols (xylitol, sorbitol, mannitol).  Try eating the following foods:  Starchy foods, such as rice, toast, pasta, low-sugar cereal, oatmeal, baked potatoes, crackers, and bagels.  Bananas.  Applesauce.  Eat probiotic-rich foods, such as yogurt and milk products that are fermented.  Wash your hands well after each time you have watery poop.  Only take medicine as told by your doctor.  Take a warm bath to help lessen burning or pain from having watery poop. GET HELP RIGHT AWAY IF:   You cannot drink fluids without throwing up (vomiting).  You keep throwing up.  You have blood in your poop, or your poop looks black and tarry.  You do not pee (urinate) in 6-8 hours, or  there is only a small amount of very dark pee.  You have belly (abdominal) pain that gets worse or stays in the same spot (localizes).  You are weak, dizzy, confused, or light-headed.  You have a very bad headache.  Your watery poop gets worse or does not get better.  You have a fever or lasting symptoms for more than 2-3 days.  You have a fever and your symptoms suddenly get worse. MAKE SURE YOU:   Understand these instructions.  Will watch your condition.  Will get help right away if you are not doing well or get worse.   This information is not intended to replace advice given to you by your health care provider. Make sure you discuss any questions you have with your health care provider.   Document Released: 09/30/2007 Document Revised: 05/04/2014 Document Reviewed: 12/20/2011 Elsevier Interactive Patient Education [employer] Elsevier Inc.

## 2015-08-08 NOTE — ED Notes (Signed)
Patient here with epigastric pain x 2 days, reports that she has tried gas-x and pepcid with no relief, nausea and multiple episodes of vomiting with same and diarrhea yesterday

## 2015-08-08 NOTE — ED Provider Notes (Signed)
CSN: [id number]     Arrival date & time 08/08/15  0946 History   First MD Initiated Contact with Patient 08/08/15 1157     Chief Complaint  Patient presents with  . Abdominal Pain     (Consider location/radiation/quality/duration/timing/severity/associated sxs/prior Treatment) HPI   Marissa [name] is a 40 y.o. female who presents emergency Department with 2 days of nausea and vomiting with mild diarrhea and associated epigastric and right upper quadrant abdominal pain that has been constant, without radiation, described as achy and full, 4 out of 10 pain with intermittent sharp stabbing episodes of more severe pain.  She has associated bloating, unrelieved with gas drops.  She denies any GERD history however states that she has had gallbladder attack before.  She denies hematochezia, melena, hematemesis.  She takes ibuprofen intermittently however she denies daily, for headaches. She denies any peptic ulcer disease history.  She denies any recent travel or suspicious food intake. Her daughter was ill with nausea vomiting diarrhea however it was more than 3 weeks ago.  She denies dysuria, hematuria, vaginal discharge, vaginal pain or irritation. She denies possibility of pregnancy.  Past Medical History  Diagnosis Date  . Hypothyroidism    History reviewed. No pertinent past surgical history. No family history on file. Social History  Substance Use Topics  . Smoking status: Former Games developer  . Smokeless tobacco: Never Used  . Alcohol Use: None   OB History    Gravida Para Term Preterm AB TAB SAB Ectopic Multiple Living   0 0 0 0 0 0 1     Review of Systems  All other systems reviewed and are negative.     Allergies  Aspirin and Sulfa antibiotics  Home Medications   Prior to Admission medications   Medication Sig Start Date End Date Taking? Authorizing Provider  levothyroxine (SYNTHROID, LEVOTHROID) 100 MCG tablet Take 100 mcg by mouth daily.   Yes Historical Provider,  MD  hydrocortisone-pramoxine [facility]) rectal foam Place 1 applicator rectally 2 (two) times daily. Patient not taking: Reported on 08/08/2015 03/11/12   Julio Sicks, NP  ibuprofen (ADVIL,MOTRIN) 600 MG tablet Take 1 tablet (600 mg total) by mouth every 6 (six) hours. Patient not taking: Reported on 08/08/2015 03/11/12   Julio Sicks, NP  ondansetron (ZOFRAN ODT) 4 MG disintegrating tablet Take 1 tablet (4 mg total) by mouth every 8 (eight) hours as needed for nausea or vomiting. 08/08/15   Danelle Berry, PA-C  pantoprazole (PROTONIX) 20 MG tablet Take 1 tablet (20 mg total) by mouth daily. 08/08/15   Danelle Berry, PA-C  Prenatal Vit-Fe Fumarate-FA (PRENATAL MULTIVITAMIN) TABS Take 1 tablet by mouth every morning.    Historical Provider, MD   BP 111/64 mmHg  Pulse 69  Temp(Src) 97.5 F (36.4 C) (Oral)  Resp 14  SpO2 96%  LMP 08/01/2015 Physical Exam  Constitutional: She is oriented to person, place, and time. She appears well-developed and well-nourished. No distress.  Well appearing female, looks uncomfortable, NAD  HENT:  Head: Normocephalic and atraumatic.  Nose: Nose normal.  Mouth/Throat: Oropharynx is clear and moist. No oropharyngeal exudate.  Eyes: Conjunctivae and EOM are normal. Pupils are equal, round, and reactive to light. Right eye exhibits no discharge. Left eye exhibits no discharge. No scleral icterus.  Neck: Normal range of motion. No JVD present. No tracheal deviation present. No thyromegaly present.  Cardiovascular: Normal rate, regular rhythm, normal heart sounds and intact distal pulses.  Exam reveals no gallop and no  friction rub.   No murmur heard. Pulmonary/Chest: Effort normal and breath sounds normal. No respiratory distress. She has no wheezes. She has no rales. She exhibits no tenderness.  Abdominal: Soft. Normal appearance. She exhibits no distension and no mass. Bowel sounds are increased. There is tenderness. There is no rigidity, no rebound, no guarding  and no CVA tenderness.    Abdomen with generalized tenderness to palpation, however worse in epigastric and right upper quadrant area  Musculoskeletal: Normal range of motion. She exhibits no edema or tenderness.  Lymphadenopathy:    She has no cervical adenopathy.  Neurological: She is alert and oriented to person, place, and time. She has normal reflexes. No cranial nerve deficit. She exhibits normal muscle tone. Coordination normal.  Skin: Skin is warm and dry. No rash noted. She is not diaphoretic. No erythema. No pallor.  Psychiatric: She has a normal mood and affect. Her behavior is normal. Judgment and thought content normal.  Nursing note and vitals reviewed.   ED Course  Procedures (including critical care time) Labs Review Labs Reviewed  COMPREHENSIVE METABOLIC PANEL - Abnormal; Notable for the following:    Glucose, Bld 110 (*)    Creatinine, Ser 1.06 (*)    ALT 11 (*)    All other components within normal limits  URINALYSIS, ROUTINE W REFLEX MICROSCOPIC (NOT AT [facility]) - Abnormal; Notable for the following:    APPearance HAZY (*)    All other components within normal limits  LIPASE, BLOOD  CBC  I-STAT BETA HCG BLOOD, ED ([facility], [facility], [facility] ONLY)    Imaging Review Kore[place] Abdomen Limited Ruq  08/08/2015  CLINICAL DATA:  Right upper quadrant pain. EXAM: US ABDOMEN LIMITED - RIGHT UPPER QUADRANT COMPARISON:  01/01/2005 FINDINGS: Gallbladder: No gallstones or wall thickening visualized. No sonographic Murphy sign noted by sonographer. Common bile duct: Diameter: 1.7 mm Liver: No focal lesion identified. Within normal limits in parenchymal echogenicity. IMPRESSION: No acute hepatobiliary findings. Electronically Signed   By: Elberta Fortisaniel  Boyle M.D.   On: 08/08/2015 15:53   I have personally reviewed and evaluated these images and lab results as part of my medical decision-making.   EKG Interpretation None      MDM   Pt with 2 days of N, V, D and abdominal pain, pt is concerned with  possible gallstones and bloating.  On exam pt has generalized abd pain, however, is more tender in epigastrum and RUQ, will get limited RUQ, suspect it will be negative, and pt likely has GI virus and possibly some gastritis/esophagitis or PUD with bloating. Labs are largely unremarkable. Urinalysis negative. Abdominal ultrasound resulted showing no hepatobiliary findings.  Patient's vital signs have been stable while in the ER. She had significant relief with GI cocktail, will give PPI trial and GI follow-up. Patient may have acute gastritis or esophagitis secondary to vomiting, she will be encouraged to see GI if these symptoms of bloating and upper gastric abdominal pain did not resolve after her acute illness resolves in with PPI trial.  She was able to tolerate fluid challenge in the ER. She was given antiemetics to be discharged home with. Return precautions reviewed.  Final diagnoses:  Epigastric pain  Nausea vomiting and diarrhea    Danelle BerryLeisa Kemiyah Tarazon, PA-C 08/19/15 [id number]  Derwood KaplanAnkit Nanavati, MD 08/19/15 1820

## 2015-08-23 DIAGNOSIS — Z0000 Encounter for general adult medical examination without abnormal findings: Secondary | ICD-10-CM | POA: Diagnosis not present

## 2015-08-23 DIAGNOSIS — N946 Dysmenorrhea, unspecified: Secondary | ICD-10-CM | POA: Diagnosis not present

## 2015-08-23 DIAGNOSIS — L301 Dyshidrosis [pompholyx]: Secondary | ICD-10-CM | POA: Diagnosis not present

## 2015-08-23 DIAGNOSIS — F419 Anxiety disorder, unspecified: Secondary | ICD-10-CM | POA: Diagnosis not present

## 2015-08-23 DIAGNOSIS — J309 Allergic rhinitis, unspecified: Secondary | ICD-10-CM | POA: Diagnosis not present

## 2015-08-23 DIAGNOSIS — R0982 Postnasal drip: Secondary | ICD-10-CM | POA: Diagnosis not present

## 2015-08-23 DIAGNOSIS — E039 Hypothyroidism, unspecified: Secondary | ICD-10-CM | POA: Diagnosis not present

## 2015-08-23 DIAGNOSIS — L509 Urticaria, unspecified: Secondary | ICD-10-CM | POA: Diagnosis not present

## 2015-08-23 DIAGNOSIS — R1030 Lower abdominal pain, unspecified: Secondary | ICD-10-CM | POA: Diagnosis not present

## 2015-08-23 MED FILL — TRIAMCINOLONE 0.1% CREAM: 0.1 | 14 days supply | Qty: 30 | Fill #0

## 2015-08-26 MED FILL — predniSONE 5 MG TABS: 5 | 4 days supply | Qty: 28 | Fill #0

## 2015-10-02 MED FILL — SYNTHROID 100 MCG TABLET: 100 | 90 days supply | Qty: 90 | Fill #1

## 2015-10-17 DIAGNOSIS — N979 Female infertility, unspecified: Secondary | ICD-10-CM | POA: Diagnosis not present

## 2015-10-17 DIAGNOSIS — Z1159 Encounter for screening for other viral diseases: Secondary | ICD-10-CM | POA: Diagnosis not present

## 2015-10-17 DIAGNOSIS — Z139 Encounter for screening, unspecified: Secondary | ICD-10-CM | POA: Diagnosis not present

## 2015-11-14 DIAGNOSIS — Z6827 Body mass index (BMI) 27.0-27.9, adult: Secondary | ICD-10-CM | POA: Diagnosis not present

## 2015-11-14 DIAGNOSIS — Z01419 Encounter for gynecological examination (general) (routine) without abnormal findings: Secondary | ICD-10-CM | POA: Diagnosis not present

## 2015-11-14 DIAGNOSIS — Z1231 Encounter for screening mammogram for malignant neoplasm of breast: Secondary | ICD-10-CM | POA: Diagnosis not present

## 2015-12-31 MED FILL — SYNTHROID 100 MCG TABLET: 100 | 90 days supply | Qty: 90 | Fill #2

## 2016-03-03 MED FILL — DOXYCYCLINE HYCLATE 100 MG: 100 | 6 days supply | Qty: 12 | Fill #0

## 2016-03-03 MED FILL — diazePAM 10 MG TABS: 10 | 1 days supply | Qty: 1 | Fill #0

## 2016-03-03 MED FILL — EMOQUETTE 28 DAY TABLET: 0.15-30 | 28 days supply | Qty: 28 | Fill #0

## 2016-03-03 MED FILL — METHYLPREDNISOLONE 16 MG TA: 16 | 6 days supply | Qty: 6 | Fill #0

## 2016-03-05 DIAGNOSIS — N979 Female infertility, unspecified: Secondary | ICD-10-CM | POA: Diagnosis not present

## 2016-03-18 DIAGNOSIS — Z3189 Encounter for other procreative management: Secondary | ICD-10-CM | POA: Diagnosis not present

## 2016-03-18 DIAGNOSIS — Z119 Encounter for screening for infectious and parasitic diseases, unspecified: Secondary | ICD-10-CM | POA: Diagnosis not present

## 2016-03-18 DIAGNOSIS — N979 Female infertility, unspecified: Secondary | ICD-10-CM | POA: Diagnosis not present

## 2016-03-24 MED FILL — SYNTHROID 100 MCG TABLET: 100 | 90 days supply | Qty: 90 | Fill #3

## 2016-03-24 MED FILL — EMOQUETTE 28 DAY TABLET: 0.15-30 | 28 days supply | Qty: 28 | Fill #1

## 2016-04-02 DIAGNOSIS — N979 Female infertility, unspecified: Secondary | ICD-10-CM | POA: Diagnosis not present

## 2016-04-02 DIAGNOSIS — Z139 Encounter for screening, unspecified: Secondary | ICD-10-CM | POA: Diagnosis not present

## 2016-04-13 DIAGNOSIS — N979 Female infertility, unspecified: Secondary | ICD-10-CM | POA: Diagnosis not present

## 2016-04-22 DIAGNOSIS — N979 Female infertility, unspecified: Secondary | ICD-10-CM | POA: Diagnosis not present

## 2016-04-24 DIAGNOSIS — N979 Female infertility, unspecified: Secondary | ICD-10-CM | POA: Diagnosis not present

## 2016-05-06 DIAGNOSIS — N979 Female infertility, unspecified: Secondary | ICD-10-CM | POA: Diagnosis not present

## 2016-05-08 DIAGNOSIS — O0900 Supervision of pregnancy with history of infertility, unspecified trimester: Secondary | ICD-10-CM | POA: Diagnosis not present

## 2016-05-11 DIAGNOSIS — O0900 Supervision of pregnancy with history of infertility, unspecified trimester: Secondary | ICD-10-CM | POA: Diagnosis not present

## 2016-05-25 DIAGNOSIS — N911 Secondary amenorrhea: Secondary | ICD-10-CM | POA: Diagnosis not present

## 2016-05-27 MED FILL — MINIVELLE 0.1 MG PATCH: 0.1 | 8 days supply | Qty: 16 | Fill #0

## 2016-06-03 DIAGNOSIS — R350 Frequency of micturition: Secondary | ICD-10-CM | POA: Diagnosis not present

## 2016-06-05 MED FILL — MINIVELLE 0.1 MG PATCH: 0.1 | 4 days supply | Qty: 8 | Fill #1

## 2016-06-12 ENCOUNTER — Encounter (HOSPITAL_COMMUNITY): Payer: Self-pay | Admitting: *Deleted

## 2016-06-12 DIAGNOSIS — N911 Secondary amenorrhea: Secondary | ICD-10-CM | POA: Diagnosis not present

## 2016-06-12 MED ORDER — DEXTROSE 5 % IV SOLN
2.0000 g | INTRAVENOUS | Status: AC
  Filled 2016-06-12: qty 2

## 2016-06-12 NOTE — H&P (Signed)
Trista B Kirker  DICTATION # [id number] CSN# [id number]   Meriel PicaHOLLAND,Artia Singley M, MD 06/12/2016 10:04 AM

## 2016-06-13 ENCOUNTER — Ambulatory Visit (HOSPITAL_COMMUNITY): Admission: RE | Admit: 2016-06-13 | Payer: 59 | Source: Ambulatory Visit | Admitting: Obstetrics and Gynecology

## 2016-06-13 ENCOUNTER — Encounter (HOSPITAL_COMMUNITY)
Admission: AD | Disposition: A | Discharge status: Home / Self Care | Payer: Self-pay | Source: Ambulatory Visit | Attending: Obstetrics and Gynecology

## 2016-06-13 ENCOUNTER — Inpatient Hospital Stay (HOSPITAL_COMMUNITY): Payer: 59 | Admitting: Anesthesiology

## 2016-06-13 ENCOUNTER — Inpatient Hospital Stay (HOSPITAL_COMMUNITY)
Admission: AD | Admit: 2016-06-13 | Discharge: 2016-06-13 | Disposition: A | Discharge status: Home / Self Care | Payer: 59 | Source: Ambulatory Visit | Attending: Obstetrics and Gynecology | Admitting: Obstetrics and Gynecology

## 2016-06-13 ENCOUNTER — Encounter (HOSPITAL_COMMUNITY): Payer: Self-pay

## 2016-06-13 DIAGNOSIS — O021 Missed abortion: Secondary | ICD-10-CM | POA: Insufficient documentation

## 2016-06-13 DIAGNOSIS — Z3A09 9 weeks gestation of pregnancy: Secondary | ICD-10-CM | POA: Insufficient documentation

## 2016-06-13 HISTORY — DX: Headache, unspecified: R51.9

## 2016-06-13 HISTORY — PX: DILATION AND EVACUATION: SHX1459

## 2016-06-13 HISTORY — DX: Gastro-esophageal reflux disease without esophagitis: K21.9

## 2016-06-13 HISTORY — DX: Anemia, unspecified: D64.9

## 2016-06-13 HISTORY — DX: Headache: R51

## 2016-06-13 HISTORY — DX: Personal history of urinary calculi: Z87.442

## 2016-06-13 LAB — URINALYSIS, ROUTINE W REFLEX MICROSCOPIC
BILIRUBIN URINE: NEGATIVE
GLUCOSE, UA: NEGATIVE mg/dL
HGB URINE DIPSTICK: NEGATIVE
KETONES UR: NEGATIVE mg/dL
LEUKOCYTES UA: NEGATIVE
Nitrite: NEGATIVE
PH: 5 (ref 5.0–8.0)
PROTEIN: NEGATIVE mg/dL
Specific Gravity, Urine: 1.024 (ref 1.005–1.030)

## 2016-06-13 LAB — CBC
HEMATOCRIT: 37.8 % (ref 36.0–46.0)
HEMOGLOBIN: 13.3 g/dL (ref 12.0–15.0)
MCH: 29 pg (ref 26.0–34.0)
MCHC: 35.2 g/dL (ref 30.0–36.0)
MCV: 82.5 fL (ref 78.0–100.0)
Platelets: 262 10*3/uL (ref 150–400)
RBC: 4.58 MIL/uL (ref 3.87–5.11)
RDW: 13 % (ref 11.5–15.5)
WBC: 6.1 10*3/uL (ref 4.0–10.5)

## 2016-06-13 LAB — TYPE AND SCREEN
ABO/RH(D): A POS
Antibody Screen: NEGATIVE

## 2016-06-13 SURGERY — DILATION AND EVACUATION, UTERUS
Anesthesia: Monitor Anesthesia Care | Site: Vagina

## 2016-06-13 MED ORDER — ONDANSETRON HCL 4 MG/2ML IJ SOLN
INTRAMUSCULAR | Status: DC | PRN
  Administered 2016-06-13: 4 mg via INTRAVENOUS

## 2016-06-13 MED ORDER — IBUPROFEN 200 MG PO TABS: 800.0000 mg | ORAL_TABLET | Freq: Three times a day (TID) | ORAL | 1 refills | Status: DC | PRN

## 2016-06-13 MED ORDER — LACTATED RINGERS IV SOLN
INTRAVENOUS | Status: DC | PRN
  Administered 2016-06-13 (×2): via INTRAVENOUS

## 2016-06-13 MED ORDER — DEXAMETHASONE SODIUM PHOSPHATE 4 MG/ML IJ SOLN
INTRAMUSCULAR | Status: DC | PRN
  Administered 2016-06-13: 10 mg via INTRAVENOUS

## 2016-06-13 MED ORDER — LACTATED RINGERS IV BOLUS (SEPSIS)
1000.0000 mL | Freq: Once | INTRAVENOUS | Status: AC
  Administered 2016-06-13: 1000 mL via INTRAVENOUS

## 2016-06-13 MED ORDER — PROPOFOL 10 MG/ML IV BOLUS
INTRAVENOUS | Status: AC
  Filled 2016-06-13: qty 20

## 2016-06-13 MED ORDER — ONDANSETRON HCL 4 MG/2ML IJ SOLN
INTRAMUSCULAR | Status: AC
  Filled 2016-06-13: qty 2

## 2016-06-13 MED ORDER — DEXAMETHASONE SODIUM PHOSPHATE 10 MG/ML IJ SOLN
INTRAMUSCULAR | Status: AC
  Filled 2016-06-13: qty 1

## 2016-06-13 MED ORDER — SOD CITRATE-CITRIC ACID 500-334 MG/5ML PO SOLN
30.0000 mL | Freq: Once | ORAL | Status: AC
  Administered 2016-06-13: 30 mL via ORAL
  Filled 2016-06-13: qty 15

## 2016-06-13 MED ORDER — MIDAZOLAM HCL 2 MG/2ML IJ SOLN
INTRAMUSCULAR | Status: AC
  Filled 2016-06-13: qty 2

## 2016-06-13 MED ORDER — PROPOFOL 10 MG/ML IV BOLUS
INTRAVENOUS | Status: DC | PRN
  Administered 2016-06-13: 20 mg via INTRAVENOUS
  Administered 2016-06-13: 10 mg via INTRAVENOUS
  Administered 2016-06-13 (×3): 20 mg via INTRAVENOUS
  Administered 2016-06-13: 30 mg via INTRAVENOUS

## 2016-06-13 MED ORDER — LIDOCAINE HCL (CARDIAC) 20 MG/ML IV SOLN
INTRAVENOUS | Status: DC | PRN
  Administered 2016-06-13: 50 mg via INTRAVENOUS

## 2016-06-13 MED ORDER — OXYCODONE-ACETAMINOPHEN 2.5-325 MG PO TABS: 1.0000 | ORAL_TABLET | ORAL | 0 refills | Status: DC | PRN

## 2016-06-13 MED ORDER — LIDOCAINE HCL 1 % IJ SOLN
INTRAMUSCULAR | Status: AC
  Filled 2016-06-13: qty 20

## 2016-06-13 MED ORDER — FENTANYL CITRATE (PF) 100 MCG/2ML IJ SOLN
INTRAMUSCULAR | Status: DC | PRN
  Administered 2016-06-13: 100 ug via INTRAVENOUS

## 2016-06-13 MED ORDER — CEFAZOLIN SODIUM-DEXTROSE 2-4 GM/100ML-% IV SOLN
INTRAVENOUS | Status: AC
  Filled 2016-06-13: qty 100

## 2016-06-13 MED ORDER — LIDOCAINE HCL 1 % IJ SOLN
INTRAMUSCULAR | Status: DC | PRN
  Administered 2016-06-13: 10 mL

## 2016-06-13 MED ORDER — LACTATED RINGERS IV SOLN: INTRAVENOUS | Status: DC

## 2016-06-13 MED ORDER — LIDOCAINE HCL (CARDIAC) 20 MG/ML IV SOLN
INTRAVENOUS | Status: AC
  Filled 2016-06-13: qty 5

## 2016-06-13 MED ORDER — KETOROLAC TROMETHAMINE 30 MG/ML IJ SOLN
INTRAMUSCULAR | Status: AC
  Filled 2016-06-13: qty 1

## 2016-06-13 MED ORDER — FENTANYL CITRATE (PF) 100 MCG/2ML IJ SOLN
INTRAMUSCULAR | Status: AC
  Filled 2016-06-13: qty 2

## 2016-06-13 MED ORDER — CEFAZOLIN SODIUM-DEXTROSE 2-3 GM-% IV SOLR
INTRAVENOUS | Status: DC | PRN
  Administered 2016-06-13: 2 g via INTRAVENOUS

## 2016-06-13 MED ORDER — FAMOTIDINE IN NACL 20-0.9 MG/50ML-% IV SOLN
20.0000 mg | Freq: Once | INTRAVENOUS | Status: AC
  Administered 2016-06-13: 20 mg via INTRAVENOUS
  Filled 2016-06-13: qty 50

## 2016-06-13 MED ORDER — MIDAZOLAM HCL 2 MG/2ML IJ SOLN
INTRAMUSCULAR | Status: DC | PRN
  Administered 2016-06-13: 2 mg via INTRAVENOUS

## 2016-06-13 SURGICAL SUPPLY — 18 items
CATH ROBINSON RED A/P 16FR (CATHETERS) ×2 IMPLANT
CLOTH BEACON ORANGE TIMEOUT ST (SAFETY) ×2 IMPLANT
DECANTER SPIKE VIAL GLASS SM (MISCELLANEOUS) ×2 IMPLANT
GLOVE BIO SURGEON STRL SZ7 (GLOVE) ×2 IMPLANT
GLOVE BIOGEL PI IND STRL 7.0 (GLOVE) ×1 IMPLANT
GLOVE BIOGEL PI INDICATOR 7.0 (GLOVE) ×1
GOWN STRL REUS W/TWL LRG LVL3 (GOWN DISPOSABLE) ×4 IMPLANT
KIT BERKELEY 1ST TRIMESTER 3/8 (MISCELLANEOUS) ×2 IMPLANT
NS IRRIG 1000ML POUR BTL (IV SOLUTION) ×2 IMPLANT
PACK VAGINAL MINOR WOMEN LF (CUSTOM PROCEDURE TRAY) ×2 IMPLANT
PAD OB MATERNITY 4.3X12.25 (PERSONAL CARE ITEMS) ×2 IMPLANT
PAD PREP 24X48 CUFFED NSTRL (MISCELLANEOUS) ×2 IMPLANT
SET BERKELEY SUCTION TUBING (SUCTIONS) ×2 IMPLANT
TOWEL OR 17X24 6PK STRL BLUE (TOWEL DISPOSABLE) ×4 IMPLANT
VACURETTE 10 RIGID CVD (CANNULA) IMPLANT
VACURETTE 7MM CVD STRL WRAP (CANNULA) IMPLANT
VACURETTE 8 RIGID CVD (CANNULA) IMPLANT
VACURETTE 9 RIGID CVD (CANNULA) IMPLANT

## 2016-06-13 NOTE — Op Note (Signed)
Preoperative diagnosis: Missed AB  Postoperative diagnosis: Same  Procedure: D&E  Surgeon: Marcelle OverlieHolland  EBL: Less than 100 cc  Procedure and findings:  Patient taken the operating room after an adequate level of general anesthesia was obtained with the patient's legs in stirrups the perineum and vagina were prepped and draped the bladder was drained propria timeouts were taken at that point.  EUA revealed that the uterus was 8 weeks size mid position, adnexa negative. Paracervical block was then created by infiltrating at 3 and 9:00 submucosally, 5-7 cc 1% plain Xylocaine at 3 and 9:00 after negative aspiration. Uterus sounded to 9 cm progressively dilated to a 27 Pratt dilator a #7 curved suction curet was then used to curette a moderate amount of tissue when no further tissue could be suctioned, a small blunt curette was used to explore the cavity revealing it to be clean there was minimal bleeding. She tolerated this well went to recovery room in good condition.  Dictated with [facility]  Marissa [name] Marissa ObeyM Nehal [name] Marissa.D.

## 2016-06-13 NOTE — Discharge Instructions (Signed)
Dilation and Curettage or Vacuum Curettage, Care After  These instructions give you information about caring for yourself after your procedure. Your doctor may also give you more specific instructions. Call your doctor if you have any problems or questions after your procedure.  Follow these instructions at home:  Activity   · Do not drive or use heavy machinery while taking prescription pain medicine.  · For 24 hours after your procedure, avoid driving.  · Take short walks often, followed by rest periods. Ask your doctor what activities are safe for you. After one or two days, you may be able to return to your normal activities.  · Do not lift anything that is heavier than 10 lb (4.5 kg) until your doctor approves.  · For at least 2 weeks, or as long as told by your doctor:  ? Do not douche.  ? Do not use tampons.  ? Do not have sex.  General instructions   · Take over-the-counter and prescription medicines only as told by your doctor. This is very important if you take blood thinning medicine.  · Do not take baths, swim, or use a hot tub until your doctor approves. Take showers instead of baths.  · Wear compression stockings as told by your doctor.  · It is up to you to get the results of your procedure. Ask your doctor when your results will be ready.  · Keep all follow-up visits as told by your doctor. This is important.  Contact a doctor if:  · You have very bad cramps that get worse or do not get better with medicine.  · You have very bad pain in your belly (abdomen).  · You cannot drink fluids without throwing up (vomiting).  · You get pain in a different part of the area between your belly and thighs (pelvis).  · You have bad-smelling discharge from your vagina.  · You have a rash.  Get help right away if:  · You are bleeding a lot from your vagina. A lot of bleeding means soaking more than one sanitary pad in an hour, for 2 hours in a row.  · You have clumps of blood (blood clots) coming from your  vagina.  · You have a fever or chills.  · Your belly feels very tender or hard.  · You have chest pain.  · You have trouble breathing.  · You cough up blood.  · You feel dizzy.  · You feel light-headed.  · You pass out (faint).  · You have pain in your neck or shoulder area.  Summary  · Take short walks often, followed by rest periods. Ask your doctor what activities are safe for you. After one or two days, you may be able to return to your normal activities.  · Do not lift anything that is heavier than 10 lb (4.5 kg) until your doctor approves.  · Do not take baths, swim, or use a hot tub until your doctor approves. Take showers instead of baths.  · Contact your doctor if you have any symptoms of infection, like bad-smelling discharge from your vagina.  This information is not intended to replace advice given to you by your health care provider. Make sure you discuss any questions you have with your health care provider.  Document Released: 01/21/2008 Document Revised: 12/30/2015 Document Reviewed: 12/30/2015  Elsevier Interactive Patient Education © 2017 [employer] Inc[employer]

## 2016-06-13 NOTE — Anesthesia Postprocedure Evaluation (Addendum)
Anesthesia Post Note  Patient: Libra B Seever  Procedure(s) Performed: Procedure(s) (LRB): DILATATION AND EVACUATION (N/A)  Patient location during evaluation: PACU Anesthesia Type: MAC Level of consciousness: awake and alert, oriented and patient cooperative Pain management: pain level controlled Vital Signs Assessment: post-procedure vital signs reviewed and stable Respiratory status: nonlabored ventilation, spontaneous breathing and respiratory function stable Cardiovascular status: blood pressure returned to baseline and stable Postop Assessment: no signs of nausea or vomiting Anesthetic complications: no        Last Vitals:  Vitals:   06/13/16 1115 06/13/16 1130  BP: 113/73 115/76  Pulse: 63 (!) 57  Resp: 20 (!) 21  Temp:      Last Pain:  Vitals:   06/13/16 0918  TempSrc: Oral   Pain Goal:                 Oaklen Thiam,E. Arber Wiemers

## 2016-06-13 NOTE — Transfer of Care (Signed)
Immediate Anesthesia Transfer of Care Note  Patient: Marissa [name]  Procedure(s) Performed: Procedure(s): DILATATION AND EVACUATION (N/A)  Patient Location: PACU  Anesthesia Type:MAC  Level of Consciousness: sedated  Airway & Oxygen Therapy: Patient Spontanous Breathing  Post-op Assessment: Report given to RN  Post vital signs: Reviewed and stable  Last Vitals:  Vitals:   06/13/16 0918 06/13/16 0920  BP:  123/77  Pulse:  62  Resp:  18  Temp: 36.9 C     Last Pain:  Vitals:   06/13/16 0918  TempSrc: Oral         Complications: No apparent anesthesia complications

## 2016-06-13 NOTE — Anesthesia Preprocedure Evaluation (Signed)
Anesthesia Evaluation  Patient identified by MRN, date of birth, ID band Patient awake    Reviewed: Allergy & Precautions, NPO status , Patient's Chart, lab work & pertinent test results  History of Anesthesia Complications Negative for: history of anesthetic complications  Airway Mallampati: II  TM Distance: >3 FB Neck ROM: Full    Dental  (+) Dental Advisory Given   Pulmonary Recent URI , Residual Cough, former smoker,    breath sounds clear to auscultation       Cardiovascular negative cardio ROS   Rhythm:Regular Rate:Normal     Neuro/Psych  Headaches,    GI/Hepatic Neg liver ROS, Nausea today   Endo/Other  Hypothyroidism   Renal/GU negative Renal ROS     Musculoskeletal   Abdominal   Peds  Hematology negative hematology ROS (+)   Anesthesia Other Findings   Reproductive/Obstetrics (+) Pregnancy                             Anesthesia Physical Anesthesia Plan  ASA: II  Anesthesia Plan: MAC   Post-op Pain Management:    Induction: Intravenous  Airway Management Planned: Natural Airway and Nasal Cannula  Additional Equipment:   Intra-op Plan:   Post-operative Plan:   Informed Consent: I have reviewed the patients History and Physical, chart, labs and discussed the procedure including the risks, benefits and alternatives for the proposed anesthesia with the patient or authorized representative who has indicated his/her understanding and acceptance.   Dental advisory given  Plan Discussed with: CRNA and Surgeon  Anesthesia Plan Comments: (Plan routine monitors, MAC)        Anesthesia Quick Evaluation

## 2016-06-13 NOTE — Progress Notes (Signed)
The patient was re-examined with no change in status 

## 2016-06-13 NOTE — MAU Note (Signed)
Pt was seen in office yesterday for visit, no FHR found. Sscheduled for D&E this morning.

## 2016-06-14 ENCOUNTER — Encounter (HOSPITAL_COMMUNITY): Payer: Self-pay | Admitting: Obstetrics and Gynecology

## 2016-06-16 NOTE — H&P (Signed)
NAMUnknown Foley:  Degregory, Koralyn             ACCOUNT NO.:  [id number]  MEDICAL RECORD NO.:  [id number]  LOCATION:                                 FACILITY:  PHYSICIAN:  Duke Salviaichard M. Marcelle OverlieHolland, M.D.    DATE OF BIRTH:  DATE OF ADMISSION:  06/13/2016 DATE OF DISCHARGE:                             HISTORY & PHYSICAL   HISTORY OF CHIEF COMPLAINT:  Missed AB.  HISTORY OF PRESENT ILLNESS:  A 41 year old, G2, P1.  She is approximately [redacted] weeks pregnant with an IVF frozen embryo pregnancy through [facility].  She has been on supplemental estrogen and progesterone.  Her first early ultrasound here on May 25, 2016, she was 6 weeks 5 days, FHR was 120.  Followup ultrasound the day prior to admission showed that she was 9 weeks, no FHR was noted consistent with a missed AB.  She presents now for D and E.  This procedure including specific risks related to bleeding, infection, other complications may require additional surgery reviewed with her, which she understands and accepts.  PAST MEDICAL HISTORY:  CURRENT MEDICATIONS:  Synthroid, prenatal vitamins, and estrogen, progesterone supplements.  ALLERGIES:  Sulfa.  SURGICAL HISTORY:  Vaginal delivery in 2013, wisdom teeth extraction.  REVIEW OF SYSTEMS:  Significant for history of hypothyroidism.  FAMILY HISTORY:  Positive for thyroid disease, UTI, hypertension, and unspecified cancer.  SOCIAL HISTORY:  Denies alcohol, tobacco, or drug use.  She is married.  PHYSICAL EXAMINATION:  VITAL SIGNS:  Temp 98.2, blood pressure 120/82. HEENT:  Unremarkable. NECK:  Supple without masses. LUNGS:  Clear. CARDIOVASCULAR:  Regular rate and rhythm without murmurs, rubs, or gallops noted. BREASTS:  Without masses. ABDOMEN:  Soft, flat, and nontender.  Vulva, vagina, and cervix normal. Uterus 8-week size, mid position.  Adnexa negative. EXTREMITIES:  Exam unremarkable. NEUROLOGIC:  Exam unremarkable.  IMPRESSION:  Missed abortion.  PLAN:  D and E  procedure and risks reviewed as above.  We will follow up on her blood type also.     Laydon Martis M. Marcelle OverlieHolland, M.D.   ______________________________ Duke Salviaichard M. Marcelle OverlieHolland, M.D.    [facility]  D:  06/12/2016  T:  06/12/2016  Job:  [id number]

## 2016-06-25 MED FILL — SYNTHROID 100 MCG TABLET: 100 | 90 days supply | Qty: 90 | Fill #0

## 2016-07-01 DIAGNOSIS — E559 Vitamin D deficiency, unspecified: Secondary | ICD-10-CM | POA: Diagnosis not present

## 2016-07-01 DIAGNOSIS — J309 Allergic rhinitis, unspecified: Secondary | ICD-10-CM | POA: Diagnosis not present

## 2016-07-01 DIAGNOSIS — R0982 Postnasal drip: Secondary | ICD-10-CM | POA: Diagnosis not present

## 2016-07-01 DIAGNOSIS — E039 Hypothyroidism, unspecified: Secondary | ICD-10-CM | POA: Diagnosis not present

## 2016-08-17 MED FILL — EMOQUETTE 28 DAY TABLET: 0.15-30 | 28 days supply | Qty: 28 | Fill #0

## 2016-08-17 MED FILL — DOXYCYCLINE HYCLATE 100 MG: 100 | 6 days supply | Qty: 12 | Fill #0

## 2016-08-17 MED FILL — BD NEEDLES 25GX1.5: 25G X 1-1/2 | 20 days supply | Qty: 20 | Fill #0

## 2016-08-17 MED FILL — METHYLPREDNISOLONE 16 MG TA: 16 | 6 days supply | Qty: 6 | Fill #0

## 2016-08-17 MED FILL — PROGESTERONE OIL 50 MG/ML V: 50 | 20 days supply | Qty: 20 | Fill #0

## 2016-08-17 MED FILL — diazePAM 10 MG TABS: 10 | 2 days supply | Qty: 1 | Fill #0

## 2016-08-17 MED FILL — ESTRADIOL 2 MG TABLET: 2 | 60 days supply | Qty: 180 | Fill #0

## 2016-08-17 MED FILL — BD 3 ML SYRINGE WITH NEEDLE: 22G X 1-1/2 | 20 days supply | Qty: 20 | Fill #0

## 2016-08-17 MED FILL — SYNERA PATCH: 70-70 | 20 days supply | Qty: 20 | Fill #0

## 2016-08-17 MED FILL — BD NEEDLES 25GX1.5": 25G X 1-1/2 | 20 days supply | Qty: 20 | Fill #0

## 2016-09-08 DIAGNOSIS — N979 Female infertility, unspecified: Secondary | ICD-10-CM | POA: Diagnosis not present

## 2016-09-14 DIAGNOSIS — Z139 Encounter for screening, unspecified: Secondary | ICD-10-CM | POA: Diagnosis not present

## 2016-09-14 DIAGNOSIS — N979 Female infertility, unspecified: Secondary | ICD-10-CM | POA: Diagnosis not present

## 2016-09-14 DIAGNOSIS — Z3141 Encounter for fertility testing: Secondary | ICD-10-CM | POA: Diagnosis not present

## 2016-09-23 MED FILL — SYNTHROID 100 MCG TABLET: 100 | 90 days supply | Qty: 90 | Fill #0

## 2016-10-05 DIAGNOSIS — N979 Female infertility, unspecified: Secondary | ICD-10-CM | POA: Diagnosis not present

## 2016-10-16 DIAGNOSIS — N979 Female infertility, unspecified: Secondary | ICD-10-CM | POA: Diagnosis not present

## 2016-10-21 DIAGNOSIS — N979 Female infertility, unspecified: Secondary | ICD-10-CM | POA: Diagnosis not present

## 2016-10-23 DIAGNOSIS — N979 Female infertility, unspecified: Secondary | ICD-10-CM | POA: Diagnosis not present

## 2016-11-02 MED FILL — PROGESTERONE OIL 50 MG/ML V: 50 | 20 days supply | Qty: 20 | Fill #1

## 2016-11-02 MED FILL — BD 3 ML SYRINGE WITH NEEDLE: 22G X 1-1/2 | 20 days supply | Qty: 20 | Fill #1

## 2016-11-03 MED FILL — SYNERA PATCH: 70-70 | 20 days supply | Qty: 20 | Fill #1

## 2016-11-04 DIAGNOSIS — N979 Female infertility, unspecified: Secondary | ICD-10-CM | POA: Diagnosis not present

## 2016-11-06 DIAGNOSIS — N979 Female infertility, unspecified: Secondary | ICD-10-CM | POA: Diagnosis not present

## 2016-11-06 MED FILL — BD NEEDLES 25GX1.5": 25G X 1-1/2 | 20 days supply | Qty: 20 | Fill #1

## 2016-11-06 MED FILL — BD NEEDLES 25GX1.5: 25G X 1-1/2 | 20 days supply | Qty: 20 | Fill #1

## 2016-11-09 DIAGNOSIS — N912 Amenorrhea, unspecified: Secondary | ICD-10-CM | POA: Diagnosis not present

## 2016-11-09 NOTE — Anesthesia Postprocedure Evaluation (Deleted)
Anesthesia Post Note  Patient: Marissa [name]  Procedure(s) Performed: Procedure(s) (LRB): DILATATION AND EVACUATION (N/A)     Anesthesia Post Evaluation  Last Vitals:  Vitals:   06/13/16 1215 06/13/16 1252  BP:  116/79  Pulse: 62 71  Resp: (!) 23 20  Temp:  36.7 C    Last Pain:  Vitals:   06/13/16 0918  TempSrc: Oral                 Dawood Spitler,E. Chenise Mulvihill

## 2016-11-09 NOTE — Addendum Note (Signed)
Addendum  created 11/09/16 1628 by Riel Hirschman, MD   Delete clinical note, Sign clinical note    

## 2016-11-09 NOTE — Addendum Note (Signed)
Addendum  created 11/09/16 1536 by Jairo BenJackson, Alazae Crymes, MD   Sign clinical note

## 2016-11-12 DIAGNOSIS — N979 Female infertility, unspecified: Secondary | ICD-10-CM | POA: Diagnosis not present

## 2016-11-16 DIAGNOSIS — N979 Female infertility, unspecified: Secondary | ICD-10-CM | POA: Diagnosis not present

## 2016-11-17 DIAGNOSIS — N911 Secondary amenorrhea: Secondary | ICD-10-CM | POA: Diagnosis not present

## 2016-11-23 MED FILL — BD NEEDLES 25GX1.5: 25G X 1-1/2 | 20 days supply | Qty: 20 | Fill #2

## 2016-11-23 MED FILL — SYNERA PATCH: 70-70 | 20 days supply | Qty: 20 | Fill #2

## 2016-11-23 MED FILL — ESTRADIOL 2 MG TABLET: 2 | 30 days supply | Qty: 90 | Fill #1

## 2016-11-23 MED FILL — BD NEEDLES 25GX1.5": 25G X 1-1/2 | 20 days supply | Qty: 20 | Fill #2

## 2016-11-23 MED FILL — PROGESTERONE OIL 50 MG/ML V: 50 | 20 days supply | Qty: 20 | Fill #2

## 2016-11-23 MED FILL — BD 3 ML SYRINGE WITH NEEDLE: 22G X 1-1/2 | 20 days supply | Qty: 20 | Fill #2

## 2016-11-27 DIAGNOSIS — N911 Secondary amenorrhea: Secondary | ICD-10-CM | POA: Diagnosis not present

## 2016-11-27 DIAGNOSIS — O3680X0 Pregnancy with inconclusive fetal viability, not applicable or unspecified: Secondary | ICD-10-CM | POA: Diagnosis not present

## 2016-12-03 DIAGNOSIS — N911 Secondary amenorrhea: Secondary | ICD-10-CM | POA: Diagnosis not present

## 2016-12-23 MED FILL — SYNTHROID 100 MCG TABLET: 100 | 90 days supply | Qty: 90 | Fill #1

## 2017-01-07 DIAGNOSIS — N96 Recurrent pregnancy loss: Secondary | ICD-10-CM | POA: Diagnosis not present

## 2017-01-07 DIAGNOSIS — E288 Other ovarian dysfunction: Secondary | ICD-10-CM | POA: Diagnosis not present

## 2017-01-07 DIAGNOSIS — Z319 Encounter for procreative management, unspecified: Secondary | ICD-10-CM | POA: Diagnosis not present

## 2017-02-10 DIAGNOSIS — Z319 Encounter for procreative management, unspecified: Secondary | ICD-10-CM | POA: Diagnosis not present

## 2017-02-10 DIAGNOSIS — E288 Other ovarian dysfunction: Secondary | ICD-10-CM | POA: Diagnosis not present

## 2017-02-10 DIAGNOSIS — Z3141 Encounter for fertility testing: Secondary | ICD-10-CM | POA: Diagnosis not present

## 2017-02-10 DIAGNOSIS — Z31430 Encounter of female for testing for genetic disease carrier status for procreative management: Secondary | ICD-10-CM | POA: Diagnosis not present

## 2017-02-10 DIAGNOSIS — Z113 Encounter for screening for infections with a predominantly sexual mode of transmission: Secondary | ICD-10-CM | POA: Diagnosis not present

## 2017-02-10 DIAGNOSIS — N8500 Endometrial hyperplasia, unspecified: Secondary | ICD-10-CM | POA: Diagnosis not present

## 2017-02-10 DIAGNOSIS — N96 Recurrent pregnancy loss: Secondary | ICD-10-CM | POA: Diagnosis not present

## 2017-02-10 MED FILL — DOXYCYCLINE HYCLATE 100 MG: 100 | 5 days supply | Qty: 10 | Fill #0

## 2017-02-12 MED FILL — MENOPUR 75 UNIT VIAL: 75 | 20 days supply | Qty: 20 | Fill #0

## 2017-02-12 MED FILL — ESTRADIOL 2 MG TABLET: 2 | 30 days supply | Qty: 60 | Fill #0

## 2017-02-12 MED FILL — METHYLPREDNISOLONE 4 MG TAB: 4 | 4 days supply | Qty: 16 | Fill #0

## 2017-02-12 MED FILL — ESTRADIOL 0.1 MG PATCH: 0.1 | 28 days supply | Qty: 8 | Fill #0

## 2017-02-12 MED FILL — DOXYCYCLINE HYCLATE 100 MG: 100 | 20 days supply | Qty: 40 | Fill #0

## 2017-02-12 MED FILL — CETROTIDE 0.25 MG KIT: 0.25 | 7 days supply | Qty: 7 | Fill #0

## 2017-02-12 MED FILL — GONAL-F 1,050 UNITS VIAL: 1050 | 3 days supply | Qty: 3 | Fill #0

## 2017-02-12 MED FILL — CHORIONIC GONAD 10,000 UNIT: 10000 | 1 days supply | Qty: 1 | Fill #0

## 2017-02-12 MED FILL — BD NEEDLES 30GX0.5": 30G X 1/2" | 30 days supply | Qty: 30 | Fill #0

## 2017-02-12 MED FILL — BD 3 ML SYRINGE 18GX1-1/2": 18G X 1-1/2 | 30 days supply | Qty: 30 | Fill #0

## 2017-02-12 MED FILL — BD NEEDLES 30GX0.5: 30G X 1/2" | 30 days supply | Qty: 30 | Fill #0

## 2017-02-12 MED FILL — BD 3 ML SYRINGE 18GX1-1/2: 18G X 1-1/2 | 30 days supply | Qty: 30 | Fill #0

## 2017-02-17 DIAGNOSIS — Z3183 Encounter for assisted reproductive fertility procedure cycle: Secondary | ICD-10-CM | POA: Diagnosis not present

## 2017-02-24 DIAGNOSIS — Z3183 Encounter for assisted reproductive fertility procedure cycle: Secondary | ICD-10-CM | POA: Diagnosis not present

## 2017-02-24 DIAGNOSIS — N979 Female infertility, unspecified: Secondary | ICD-10-CM | POA: Diagnosis not present

## 2017-02-26 MED FILL — GONAL-F RFF REDI-JECT 450 U: 450 | 3 days supply | Qty: 2 | Fill #0

## 2017-03-01 DIAGNOSIS — N979 Female infertility, unspecified: Secondary | ICD-10-CM | POA: Diagnosis not present

## 2017-03-01 DIAGNOSIS — Z3183 Encounter for assisted reproductive fertility procedure cycle: Secondary | ICD-10-CM | POA: Diagnosis not present

## 2017-03-01 MED FILL — GONAL-F 1,050 UNITS VIAL: 1050 | 3 days supply | Qty: 3 | Fill #1

## 2017-03-03 MED FILL — MENOPUR 75 UNIT VIAL: 75 | 20 days supply | Qty: 20 | Fill #1

## 2017-03-04 DIAGNOSIS — N979 Female infertility, unspecified: Secondary | ICD-10-CM | POA: Diagnosis not present

## 2017-03-04 DIAGNOSIS — Z3183 Encounter for assisted reproductive fertility procedure cycle: Secondary | ICD-10-CM | POA: Diagnosis not present

## 2017-03-05 DIAGNOSIS — N979 Female infertility, unspecified: Secondary | ICD-10-CM | POA: Diagnosis not present

## 2017-03-05 DIAGNOSIS — Z3183 Encounter for assisted reproductive fertility procedure cycle: Secondary | ICD-10-CM | POA: Diagnosis not present

## 2017-03-08 DIAGNOSIS — N979 Female infertility, unspecified: Secondary | ICD-10-CM | POA: Diagnosis not present

## 2017-03-08 DIAGNOSIS — N96 Recurrent pregnancy loss: Secondary | ICD-10-CM | POA: Diagnosis not present

## 2017-03-08 DIAGNOSIS — E2839 Other primary ovarian failure: Secondary | ICD-10-CM | POA: Diagnosis not present

## 2017-03-08 DIAGNOSIS — Z3183 Encounter for assisted reproductive fertility procedure cycle: Secondary | ICD-10-CM | POA: Diagnosis not present

## 2017-03-08 MED FILL — CETROTIDE 0.25 MG KIT: 0.25 | 2 days supply | Qty: 2 | Fill #1

## 2017-03-09 DIAGNOSIS — Z3183 Encounter for assisted reproductive fertility procedure cycle: Secondary | ICD-10-CM | POA: Diagnosis not present

## 2017-03-09 DIAGNOSIS — E2839 Other primary ovarian failure: Secondary | ICD-10-CM | POA: Diagnosis not present

## 2017-03-09 DIAGNOSIS — N96 Recurrent pregnancy loss: Secondary | ICD-10-CM | POA: Diagnosis not present

## 2017-03-09 DIAGNOSIS — N979 Female infertility, unspecified: Secondary | ICD-10-CM | POA: Diagnosis not present

## 2017-03-10 MED FILL — PROMETHAZINE 12.5 MG TABLET: 12.5 | 3 days supply | Qty: 10 | Fill #0

## 2017-03-10 MED FILL — OXYCODONE-ACETAMINOPHEN 5-3: 5-325 | 3 days supply | Qty: 10 | Fill #0

## 2017-03-11 DIAGNOSIS — Z3183 Encounter for assisted reproductive fertility procedure cycle: Secondary | ICD-10-CM | POA: Diagnosis not present

## 2017-03-11 DIAGNOSIS — Z3141 Encounter for fertility testing: Secondary | ICD-10-CM | POA: Diagnosis not present

## 2017-03-16 DIAGNOSIS — Z3183 Encounter for assisted reproductive fertility procedure cycle: Secondary | ICD-10-CM | POA: Diagnosis not present

## 2017-03-17 DIAGNOSIS — L509 Urticaria, unspecified: Secondary | ICD-10-CM | POA: Diagnosis not present

## 2017-03-17 DIAGNOSIS — Z3183 Encounter for assisted reproductive fertility procedure cycle: Secondary | ICD-10-CM | POA: Diagnosis not present

## 2017-03-17 DIAGNOSIS — E039 Hypothyroidism, unspecified: Secondary | ICD-10-CM | POA: Diagnosis not present

## 2017-03-17 DIAGNOSIS — E559 Vitamin D deficiency, unspecified: Secondary | ICD-10-CM | POA: Diagnosis not present

## 2017-03-17 DIAGNOSIS — J309 Allergic rhinitis, unspecified: Secondary | ICD-10-CM | POA: Diagnosis not present

## 2017-03-17 DIAGNOSIS — F419 Anxiety disorder, unspecified: Secondary | ICD-10-CM | POA: Diagnosis not present

## 2017-03-17 DIAGNOSIS — L301 Dyshidrosis [pompholyx]: Secondary | ICD-10-CM | POA: Diagnosis not present

## 2017-03-23 MED FILL — SYNTHROID 100 MCG TABLET: 100 | 90 days supply | Qty: 90 | Fill #2

## 2017-03-31 DIAGNOSIS — N96 Recurrent pregnancy loss: Secondary | ICD-10-CM | POA: Diagnosis not present

## 2017-03-31 DIAGNOSIS — Z3183 Encounter for assisted reproductive fertility procedure cycle: Secondary | ICD-10-CM | POA: Diagnosis not present

## 2017-03-31 DIAGNOSIS — N979 Female infertility, unspecified: Secondary | ICD-10-CM | POA: Diagnosis not present

## 2017-03-31 DIAGNOSIS — E2839 Other primary ovarian failure: Secondary | ICD-10-CM | POA: Diagnosis not present

## 2017-04-01 DIAGNOSIS — I499 Cardiac arrhythmia, unspecified: Secondary | ICD-10-CM | POA: Diagnosis not present

## 2017-04-07 MED FILL — ESTRADIOL 0.1 MG PATCH: 0.1 | 56 days supply | Qty: 16 | Fill #1

## 2017-04-09 DIAGNOSIS — Z3183 Encounter for assisted reproductive fertility procedure cycle: Secondary | ICD-10-CM | POA: Diagnosis not present

## 2017-04-10 DIAGNOSIS — Z3183 Encounter for assisted reproductive fertility procedure cycle: Secondary | ICD-10-CM | POA: Diagnosis not present

## 2017-04-19 DIAGNOSIS — N979 Female infertility, unspecified: Secondary | ICD-10-CM | POA: Diagnosis not present

## 2017-04-19 DIAGNOSIS — E2839 Other primary ovarian failure: Secondary | ICD-10-CM | POA: Diagnosis not present

## 2017-04-19 DIAGNOSIS — N96 Recurrent pregnancy loss: Secondary | ICD-10-CM | POA: Diagnosis not present

## 2017-04-19 DIAGNOSIS — Z3200 Encounter for pregnancy test, result unknown: Secondary | ICD-10-CM | POA: Diagnosis not present

## 2017-04-19 DIAGNOSIS — Z3183 Encounter for assisted reproductive fertility procedure cycle: Secondary | ICD-10-CM | POA: Diagnosis not present

## 2017-04-21 DIAGNOSIS — Z3183 Encounter for assisted reproductive fertility procedure cycle: Secondary | ICD-10-CM | POA: Diagnosis not present

## 2017-04-21 DIAGNOSIS — N96 Recurrent pregnancy loss: Secondary | ICD-10-CM | POA: Diagnosis not present

## 2017-04-21 DIAGNOSIS — N979 Female infertility, unspecified: Secondary | ICD-10-CM | POA: Diagnosis not present

## 2017-04-21 DIAGNOSIS — E2839 Other primary ovarian failure: Secondary | ICD-10-CM | POA: Diagnosis not present

## 2017-04-21 DIAGNOSIS — Z3200 Encounter for pregnancy test, result unknown: Secondary | ICD-10-CM | POA: Diagnosis not present

## 2017-04-21 MED FILL — PROGESTERONE OIL 50 MG/ML V: 50 | 20 days supply | Qty: 20 | Fill #3

## 2017-04-21 MED FILL — ESTRADIOL 2 MG TABLET: 2 | 30 days supply | Qty: 60 | Fill #1

## 2017-05-05 DIAGNOSIS — E039 Hypothyroidism, unspecified: Secondary | ICD-10-CM | POA: Diagnosis not present

## 2017-05-20 MED FILL — PROGESTERONE 200 MG CAPSULE: 200 | 14 days supply | Qty: 14 | Fill #0

## 2017-06-03 MED FILL — SYNTHROID 100 MCG TABLET: 100 | 90 days supply | Qty: 90 | Fill #3

## 2017-06-04 LAB — OB RESULTS CONSOLE HEPATITIS B SURFACE ANTIGEN: Hepatitis B Surface Ag: NEGATIVE

## 2017-06-04 LAB — OB RESULTS CONSOLE GBS: GBS: POSITIVE

## 2017-06-04 LAB — OB RESULTS CONSOLE ABO/RH: RH Type: POSITIVE

## 2017-06-04 LAB — OB RESULTS CONSOLE HIV ANTIBODY (ROUTINE TESTING): HIV: NONREACTIVE

## 2017-06-04 LAB — OB RESULTS CONSOLE ANTIBODY SCREEN: Antibody Screen: NEGATIVE

## 2017-06-04 LAB — OB RESULTS CONSOLE RUBELLA ANTIBODY, IGM: RUBELLA: IMMUNE

## 2017-06-04 LAB — OB RESULTS CONSOLE RPR: RPR: NONREACTIVE

## 2017-06-04 MED FILL — VITAFOL ULTRA SOFTGEL: 29-0.6-0.4- | 30 days supply | Qty: 30 | Fill #0

## 2017-06-07 LAB — OB RESULTS CONSOLE GC/CHLAMYDIA
Chlamydia: NEGATIVE
GC PROBE AMP, GENITAL: NEGATIVE

## 2017-06-07 MED FILL — CEPHALEXIN 500 MG CAPSULE: 500 | 7 days supply | Qty: 14 | Fill #0

## 2017-06-12 ENCOUNTER — Encounter (HOSPITAL_COMMUNITY): Payer: Self-pay

## 2017-07-12 MED FILL — VITAFOL ULTRA SOFTGEL: 29-0.6-0.4- | 30 days supply | Qty: 30 | Fill #1

## 2017-08-10 MED FILL — VITAFOL ULTRA SOFTGEL: 29-0.6-0.4- | 30 days supply | Qty: 30 | Fill #2

## 2017-08-13 MED FILL — SYNTHROID 100 MCG TABLET: 100 | 90 days supply | Qty: 90 | Fill #0

## 2017-09-09 MED FILL — VITAFOL ULTRA SOFTGEL: 29-0.6-0.4- | 30 days supply | Qty: 30 | Fill #3

## 2017-10-11 MED FILL — VITAFOL ULTRA SOFTGEL: 29-0.6-0.4- | 30 days supply | Qty: 30 | Fill #4

## 2017-10-21 MED FILL — SYNTHROID 100 MCG TABLET: 100 | 30 days supply | Qty: 38 | Fill #0

## 2017-11-10 MED FILL — VITAFOL ULTRA SOFTGEL: 29-0.6-0.4- | 30 days supply | Qty: 30 | Fill #5

## 2017-11-15 MED FILL — SYNTHROID 100 MCG TABLET: 100 | 28 days supply | Qty: 38 | Fill #0

## 2017-12-08 ENCOUNTER — Encounter (HOSPITAL_COMMUNITY): Payer: Self-pay | Admitting: *Deleted

## 2017-12-08 ENCOUNTER — Telehealth (HOSPITAL_COMMUNITY): Payer: Self-pay | Admitting: *Deleted

## 2017-12-08 NOTE — Telephone Encounter (Signed)
Preadmission screen  

## 2017-12-09 ENCOUNTER — Encounter (HOSPITAL_COMMUNITY): Payer: Self-pay | Admitting: *Deleted

## 2017-12-09 ENCOUNTER — Telehealth (HOSPITAL_COMMUNITY): Payer: Self-pay | Admitting: *Deleted

## 2017-12-09 MED FILL — VITAFOL ULTRA SOFTGEL: 29-0.6-0.4- | 30 days supply | Qty: 30 | Fill #6

## 2017-12-09 NOTE — Telephone Encounter (Signed)
Preadmission screen  

## 2017-12-15 ENCOUNTER — Other Ambulatory Visit: Payer: Self-pay | Admitting: Obstetrics and Gynecology

## 2017-12-22 ENCOUNTER — Encounter (HOSPITAL_COMMUNITY): Payer: Self-pay

## 2017-12-22 ENCOUNTER — Inpatient Hospital Stay (HOSPITAL_COMMUNITY): Payer: No Typology Code available for payment source | Admitting: Anesthesiology

## 2017-12-22 ENCOUNTER — Inpatient Hospital Stay (HOSPITAL_COMMUNITY)
Admission: RE | Admit: 2017-12-22 | Discharge: 2017-12-25 | DRG: 787 | Disposition: A | Discharge status: Home / Self Care | Payer: No Typology Code available for payment source | Attending: Obstetrics and Gynecology | Admitting: Obstetrics and Gynecology

## 2017-12-22 VITALS — BP 118/74 | HR 80 | Temp 98.2°F | Resp 18 | Ht 66.0 in | Wt 241.0 lb

## 2017-12-22 DIAGNOSIS — Z3A39 39 weeks gestation of pregnancy: Secondary | ICD-10-CM | POA: Diagnosis not present

## 2017-12-22 DIAGNOSIS — O99824 Streptococcus B carrier state complicating childbirth: Secondary | ICD-10-CM | POA: Diagnosis present

## 2017-12-22 DIAGNOSIS — D62 Acute posthemorrhagic anemia: Secondary | ICD-10-CM | POA: Diagnosis not present

## 2017-12-22 DIAGNOSIS — O9081 Anemia of the puerperium: Secondary | ICD-10-CM | POA: Diagnosis not present

## 2017-12-22 DIAGNOSIS — O99284 Endocrine, nutritional and metabolic diseases complicating childbirth: Secondary | ICD-10-CM | POA: Diagnosis present

## 2017-12-22 DIAGNOSIS — O403XX0 Polyhydramnios, third trimester, not applicable or unspecified: Secondary | ICD-10-CM | POA: Diagnosis present

## 2017-12-22 DIAGNOSIS — Z87891 Personal history of nicotine dependence: Secondary | ICD-10-CM | POA: Diagnosis not present

## 2017-12-22 DIAGNOSIS — E039 Hypothyroidism, unspecified: Secondary | ICD-10-CM | POA: Diagnosis present

## 2017-12-22 DIAGNOSIS — O99892 Other specified diseases and conditions complicating childbirth: Secondary | ICD-10-CM

## 2017-12-22 DIAGNOSIS — O3663X0 Maternal care for excessive fetal growth, third trimester, not applicable or unspecified: Principal | ICD-10-CM | POA: Diagnosis present

## 2017-12-22 DIAGNOSIS — Z3490 Encounter for supervision of normal pregnancy, unspecified, unspecified trimester: Secondary | ICD-10-CM | POA: Diagnosis present

## 2017-12-22 DIAGNOSIS — O9902 Anemia complicating childbirth: Secondary | ICD-10-CM | POA: Diagnosis present

## 2017-12-22 LAB — RPR: RPR Ser Ql: NONREACTIVE

## 2017-12-22 LAB — CBC
HCT: 34.4 % — ABNORMAL LOW (ref 36.0–46.0)
Hemoglobin: 11.4 g/dL — ABNORMAL LOW (ref 12.0–15.0)
MCH: 26.2 pg (ref 26.0–34.0)
MCHC: 33.1 g/dL (ref 30.0–36.0)
MCV: 79.1 fL (ref 78.0–100.0)
Platelets: 232 10*3/uL (ref 150–400)
RBC: 4.35 MIL/uL (ref 3.87–5.11)
RDW: 14.5 % (ref 11.5–15.5)
WBC: 7.7 10*3/uL (ref 4.0–10.5)

## 2017-12-22 LAB — TYPE AND SCREEN
ABO/RH(D): A POS
Antibody Screen: NEGATIVE

## 2017-12-22 MED ORDER — PHENYLEPHRINE 40 MCG/ML (10ML) SYRINGE FOR IV PUSH (FOR BLOOD PRESSURE SUPPORT): 80.0000 ug | PREFILLED_SYRINGE | INTRAVENOUS | Status: DC | PRN

## 2017-12-22 MED ORDER — OXYTOCIN 40 UNITS IN LACTATED RINGERS INFUSION - SIMPLE MED: 2.5000 [IU]/h | INTRAVENOUS | Status: DC

## 2017-12-22 MED ORDER — DIPHENHYDRAMINE HCL 50 MG/ML IJ SOLN: 12.5000 mg | INTRAMUSCULAR | Status: DC | PRN

## 2017-12-22 MED ORDER — LACTATED RINGERS IV SOLN
500.0000 mL | Freq: Once | INTRAVENOUS | Status: AC
  Administered 2017-12-22: 500 mL via INTRAVENOUS

## 2017-12-22 MED ORDER — SODIUM CHLORIDE 0.9 % IV SOLN
5.0000 10*6.[IU] | Freq: Once | INTRAVENOUS | Status: AC
  Administered 2017-12-22: 5 10*6.[IU] via INTRAVENOUS
  Filled 2017-12-22: qty 5

## 2017-12-22 MED ORDER — SOD CITRATE-CITRIC ACID 500-334 MG/5ML PO SOLN
30.0000 mL | ORAL | Status: DC | PRN
  Administered 2017-12-23: 30 mL via ORAL
  Filled 2017-12-22 (×2): qty 15

## 2017-12-22 MED ORDER — LIDOCAINE HCL (PF) 1 % IJ SOLN
INTRAMUSCULAR | Status: DC | PRN
  Administered 2017-12-22: 3 mL via EPIDURAL
  Administered 2017-12-22: 6 mL via EPIDURAL
  Administered 2017-12-22: 3 mL via EPIDURAL

## 2017-12-22 MED ORDER — EPHEDRINE 5 MG/ML INJ: 10.0000 mg | INTRAVENOUS | Status: DC | PRN

## 2017-12-22 MED ORDER — ONDANSETRON HCL 4 MG/2ML IJ SOLN
4.0000 mg | Freq: Four times a day (QID) | INTRAMUSCULAR | Status: DC | PRN
  Administered 2017-12-22 – 2017-12-23 (×2): 4 mg via INTRAVENOUS
  Filled 2017-12-22: qty 2

## 2017-12-22 MED ORDER — FENTANYL 2.5 MCG/ML BUPIVACAINE 1/10 % EPIDURAL INFUSION (WH - ANES)
14.0000 mL/h | INTRAMUSCULAR | Status: DC | PRN
  Administered 2017-12-22 – 2017-12-23 (×3): 14 mL/h via EPIDURAL
  Filled 2017-12-22 (×3): qty 100

## 2017-12-22 MED ORDER — LACTATED RINGERS IV SOLN: 500.0000 mL | INTRAVENOUS | Status: DC | PRN

## 2017-12-22 MED ORDER — PHENYLEPHRINE 40 MCG/ML (10ML) SYRINGE FOR IV PUSH (FOR BLOOD PRESSURE SUPPORT)
80.0000 ug | PREFILLED_SYRINGE | INTRAVENOUS | Status: DC | PRN
  Filled 2017-12-22: qty 10

## 2017-12-22 MED ORDER — OXYTOCIN 40 UNITS IN LACTATED RINGERS INFUSION - SIMPLE MED
1.0000 m[IU]/min | INTRAVENOUS | Status: DC
  Administered 2017-12-22: 2 m[IU]/min via INTRAVENOUS
  Administered 2017-12-23: 20 m[IU]/min via INTRAVENOUS
  Filled 2017-12-22 (×2): qty 1000

## 2017-12-22 MED ORDER — LACTATED RINGERS IV SOLN
INTRAVENOUS | Status: DC
  Administered 2017-12-22 – 2017-12-23 (×4): via INTRAVENOUS

## 2017-12-22 MED ORDER — PENICILLIN G 3 MILLION UNITS IVPB - SIMPLE MED
3.0000 10*6.[IU] | INTRAVENOUS | Status: DC
  Administered 2017-12-22 – 2017-12-23 (×6): 3 10*6.[IU] via INTRAVENOUS
  Filled 2017-12-22 (×3): qty 3
  Filled 2017-12-22: qty 100
  Filled 2017-12-22 (×3): qty 3

## 2017-12-22 MED ORDER — LIDOCAINE HCL (PF) 1 % IJ SOLN: 30.0000 mL | INTRAMUSCULAR | Status: DC | PRN

## 2017-12-22 MED ORDER — OXYTOCIN BOLUS FROM INFUSION: 500.0000 mL | Freq: Once | INTRAVENOUS | Status: DC

## 2017-12-22 MED ORDER — TERBUTALINE SULFATE 1 MG/ML IJ SOLN: 0.2500 mg | Freq: Once | INTRAMUSCULAR | Status: DC | PRN

## 2017-12-22 MED ORDER — ACETAMINOPHEN 325 MG PO TABS: 650.0000 mg | ORAL_TABLET | ORAL | Status: DC | PRN

## 2017-12-22 NOTE — H&P (Signed)
Marissa [name] is a 42 y.o. female presenting for IOL for AMA >40 and presumed LGA. OB History    Gravida  4   Para  1   Term  1   Preterm  0   AB  2   Living  1     SAB  2   TAB  0   Ectopic  0   Multiple  0   Live Births  1          Past Medical History:  Diagnosis Date  . Anemia    boarderline in late 20's  . GERD (gastroesophageal reflux disease)   . Headache    migraines  . History of kidney stones    20 years ago  . History of pre-eclampsia in prior pregnancy, currently pregnant   . Hypothyroidism    Past Surgical History:  Procedure Laterality Date  . DILATION AND EVACUATION N/A 06/13/2016   Procedure: DILATATION AND EVACUATION;  Surgeon: Marissa Overlieichard Holland, MD;  Location: [facility];  Service: Gynecology;  Laterality: N/A;  . WISDOM TOOTH EXTRACTION     Family History: family history includes Brain cancer in her paternal grandmother; Hypertension in her father. Social History:  reports that she has quit smoking. Her smoking use included cigarettes. She has a 6.50 pack-year smoking history. She has never used smokeless tobacco. She reports that she drinks about 1.0 standard drinks of alcohol per week. She reports that she does not use drugs.     Maternal Diabetes: No Genetic Screening: Normal Maternal Ultrasounds/Referrals: Normal Fetal Ultrasounds or other Referrals:  None Maternal Substance Abuse:  No Significant Maternal Medications:  None Significant Maternal Lab Results:  None Other Comments:  None  Review of Systems  Constitutional: Negative.   All other systems reviewed and are negative.  Maternal Medical History:  Fetal activity: Perceived fetal activity is normal.   Last perceived fetal movement was within the past hour.    Prenatal complications: Polyhydramnios.   Prenatal Complications - Diabetes: none.    Dilation: 2 Effacement (%): 50 Station: -2 Exam by:: Dr. Billy [name] Blood pressure 128/82, pulse 80, temperature 98.3 F (36.8  C), temperature source Axillary, resp. rate 16, height 5\' 6"  (1.676 m), weight 109.3 kg, unknown if currently breastfeeding. Maternal Exam:  Uterine Assessment: Contraction strength is mild.  Contraction frequency is rare.   Abdomen: Patient reports no abdominal tenderness. Fetal presentation: vertex  Introitus: Normal vulva. Normal vagina.  Ferning test: not done.  Nitrazine test: not done. Amniotic fluid character: not assessed.  Pelvis: questionable for delivery.   Cervix: Cervix evaluated by digital exam.     Physical Exam  Nursing note and vitals reviewed. Constitutional: She is oriented to person, place, and time. She appears well-developed and well-nourished.  Neck: Normal range of motion. Neck supple.  Cardiovascular: Normal rate and regular rhythm.  Respiratory: Effort normal and breath sounds normal.  GI: Soft. Bowel sounds are normal.  Genitourinary: Vagina normal and uterus normal.  Musculoskeletal: Normal range of motion.  Neurological: She is alert and oriented to person, place, and time. She has normal reflexes.  Skin: Skin is warm and dry.  Psychiatric: She has a normal mood and affect.    Prenatal labs: ABO, Rh: --/--/A POS (08/28 0747) Antibody: NEG (08/28 0747) Rubella: Immune (02/08 0000) RPR: Nonreactive (02/08 0000)  HBsAg: Negative (02/08 0000)  HIV: Non-reactive (02/08 0000)  GBS: Positive (02/08 0000)   Assessment/Plan: AMA>40 LGA IOL   Marissa [name] J 12/22/2017, 1:34 PM

## 2017-12-22 NOTE — Anesthesia Preprocedure Evaluation (Signed)
Anesthesia Evaluation  Patient identified by MRN, date of birth, ID band Patient awake    Reviewed: Allergy & Precautions, NPO status , Patient's Chart, lab work & pertinent test results  Airway Mallampati: II  TM Distance: >3 FB Neck ROM: Full    Dental no notable dental hx. (+) Teeth Intact   Pulmonary neg pulmonary ROS, former smoker,    Pulmonary exam normal breath sounds clear to auscultation       Cardiovascular negative cardio ROS Normal cardiovascular exam Rhythm:Regular Rate:Normal     Neuro/Psych  Headaches, negative psych ROS   GI/Hepatic Neg liver ROS, GERD  ,  Endo/Other  Hypothyroidism   Renal/GU negative Renal ROS  negative genitourinary   Musculoskeletal negative musculoskeletal ROS (+)   Abdominal   Peds  Hematology negative hematology ROS (+) anemia ,   Anesthesia Other Findings   Reproductive/Obstetrics (+) Pregnancy                             Anesthesia Physical Anesthesia Plan  ASA: II  Anesthesia Plan: Epidural   Post-op Pain Management:    Induction:   PONV Risk Score and Plan: 2 and Treatment may vary due to age or medical condition  Airway Management Planned: Natural Airway  Additional Equipment:   Intra-op Plan:   Post-operative Plan:   Informed Consent: I have reviewed the patients History and Physical, chart, labs and discussed the procedure including the risks, benefits and alternatives for the proposed anesthesia with the patient or authorized representative who has indicated his/her understanding and acceptance.     Plan Discussed with: Anesthesiologist  Anesthesia Plan Comments: (Patient identified. Risks, benefits, options discussed with patient including but not limited to bleeding, infection, nerve damage, paralysis, failed block, incomplete pain control, headache, blood pressure changes, nausea, vomiting, reactions to medication,  itching, and post partum back pain. Confirmed with bedside nurse the patient's most recent platelet count. Confirmed with the patient that they are not taking any anticoagulation, have any bleeding history or any family history of bleeding disorders. Patient expressed understanding and wishes to proceed. All questions were answered. )        Anesthesia Quick Evaluation

## 2017-12-22 NOTE — Progress Notes (Signed)
Called Dr.Taavon to update about patient FHR tracing, cervix 5/70/-1 with MVUs 220. Verbal order to turn Pitocin off for 2 hours then restart with dose of 10. Continue to monitor.

## 2017-12-22 NOTE — Anesthesia Pain Management Evaluation Note (Signed)
  CRNA Pain Management Visit Note  Patient: Marissa [name], 42 y.o., female  "Hello I am a member of the anesthesia team at [facility]. We have an anesthesia team available at all times to provide care throughout the hospital, including epidural management and anesthesia for C-section. I don't know your plan for the delivery whether it a natural birth, water birth, IV sedation, nitrous supplementation, doula or epidural, but we want to meet your pain goals."   1.Was your pain managed to your expectations on prior hospitalizations?   No. Patient received IV pain medication in the past and did not like the side effects.  2.What is your expectation for pain management during this hospitalization?     Epidural  3.How can we help you reach that goal? Epidural when desired. Notified RN that patient would like to pass on IV pain medication and will probably ask for epidural soon.  Record the patient's initial score and the patient's pain goal.   Pain: 5  Pain Goal:6 [facility] wants you to be able to say your pain was always managed very well.  Genever Hentges 12/22/2017

## 2017-12-22 NOTE — Anesthesia Procedure Notes (Signed)
Epidural Patient location during procedure: OB Start time: 12/22/2017 3:35 PM End time: 12/22/2017 3:55 PM  Staffing Anesthesiologist: Elmer PickerWoodrum, Dandra Velardi L, MD Performed: anesthesiologist   Preanesthetic Checklist Completed: patient identified, pre-op evaluation, timeout performed, IV checked, risks and benefits discussed and monitors and equipment checked  Epidural Patient position: sitting Prep: site prepped and draped and DuraPrep Patient monitoring: continuous pulse ox, blood pressure, heart rate and cardiac monitor Approach: midline Location: L3-L4 Injection technique: LOR air  Needle:  Needle type: Tuohy  Needle gauge: 17 G Needle length: 9 cm Needle insertion depth: 5.5 cm Catheter type: closed end flexible Catheter size: 19 Gauge Catheter at skin depth: 11 cm Test dose: negative  Assessment Sensory level: T8 Events: blood not aspirated, injection not painful, no injection resistance, negative IV test and no paresthesia  Additional Notes Reason for block:procedure for pain

## 2017-12-23 ENCOUNTER — Encounter (HOSPITAL_COMMUNITY): Payer: Self-pay

## 2017-12-23 ENCOUNTER — Encounter (HOSPITAL_COMMUNITY)
Admission: RE | Disposition: A | Discharge status: Home / Self Care | Payer: Self-pay | Source: Home / Self Care | Attending: Obstetrics and Gynecology

## 2017-12-23 DIAGNOSIS — O99892 Other specified diseases and conditions complicating childbirth: Secondary | ICD-10-CM | POA: Diagnosis not present

## 2017-12-23 SURGERY — Surgical Case
Anesthesia: Regional

## 2017-12-23 MED ORDER — SCOPOLAMINE 1 MG/3DAYS TD PT72: 1.0000 | MEDICATED_PATCH | Freq: Once | TRANSDERMAL | Status: DC

## 2017-12-23 MED ORDER — SCOPOLAMINE 1 MG/3DAYS TD PT72
MEDICATED_PATCH | TRANSDERMAL | Status: AC
  Filled 2017-12-23: qty 1

## 2017-12-23 MED ORDER — CEFAZOLIN SODIUM-DEXTROSE 2-4 GM/100ML-% IV SOLN: 2.0000 g | INTRAVENOUS | Status: DC

## 2017-12-23 MED ORDER — OXYCODONE-ACETAMINOPHEN 5-325 MG PO TABS
1.0000 | ORAL_TABLET | ORAL | Status: DC | PRN
  Administered 2017-12-24 (×2): 1 via ORAL
  Filled 2017-12-23 (×2): qty 1

## 2017-12-23 MED ORDER — ACETAMINOPHEN 10 MG/ML IV SOLN: 1000.0000 mg | Freq: Once | INTRAVENOUS | Status: DC | PRN

## 2017-12-23 MED ORDER — LACTATED RINGERS IV SOLN
INTRAVENOUS | Status: DC | PRN
  Administered 2017-12-23 (×2): via INTRAVENOUS

## 2017-12-23 MED ORDER — NALBUPHINE HCL 10 MG/ML IJ SOLN: 5.0000 mg | INTRAMUSCULAR | Status: DC | PRN

## 2017-12-23 MED ORDER — HYDROCODONE-ACETAMINOPHEN 7.5-325 MG PO TABS: 1.0000 | ORAL_TABLET | Freq: Once | ORAL | Status: DC | PRN

## 2017-12-23 MED ORDER — WITCH HAZEL-GLYCERIN EX PADS: 1.0000 "application " | MEDICATED_PAD | CUTANEOUS | Status: DC | PRN

## 2017-12-23 MED ORDER — OXYTOCIN 40 UNITS IN LACTATED RINGERS INFUSION - SIMPLE MED: 2.5000 [IU]/h | INTRAVENOUS | Status: AC

## 2017-12-23 MED ORDER — KETOROLAC TROMETHAMINE 30 MG/ML IJ SOLN
30.0000 mg | Freq: Four times a day (QID) | INTRAMUSCULAR | Status: AC | PRN
  Administered 2017-12-23: 30 mg via INTRAVENOUS
  Filled 2017-12-23: qty 1

## 2017-12-23 MED ORDER — METHYLERGONOVINE MALEATE 0.2 MG PO TABS: 0.2000 mg | ORAL_TABLET | ORAL | Status: DC | PRN

## 2017-12-23 MED ORDER — FENTANYL CITRATE (PF) 100 MCG/2ML IJ SOLN: 100.0000 ug | Freq: Once | INTRAMUSCULAR | Status: DC

## 2017-12-23 MED ORDER — ONDANSETRON HCL 4 MG/2ML IJ SOLN: 4.0000 mg | Freq: Three times a day (TID) | INTRAMUSCULAR | Status: DC | PRN

## 2017-12-23 MED ORDER — KETOROLAC TROMETHAMINE 30 MG/ML IJ SOLN: 30.0000 mg | Freq: Four times a day (QID) | INTRAMUSCULAR | Status: AC | PRN

## 2017-12-23 MED ORDER — MEPERIDINE HCL 25 MG/ML IJ SOLN: 6.2500 mg | INTRAMUSCULAR | Status: DC | PRN

## 2017-12-23 MED ORDER — LACTATED RINGERS IV SOLN: INTRAVENOUS | Status: DC

## 2017-12-23 MED ORDER — OXYTOCIN 10 UNIT/ML IJ SOLN
INTRAVENOUS | Status: DC | PRN
  Administered 2017-12-23: 40 [IU] via INTRAVENOUS

## 2017-12-23 MED ORDER — PRENATAL MULTIVITAMIN CH
1.0000 | ORAL_TABLET | Freq: Every day | ORAL | Status: DC
  Administered 2017-12-25: 1 via ORAL
  Filled 2017-12-23: qty 1

## 2017-12-23 MED ORDER — BUPIVACAINE HCL (PF) 0.25 % IJ SOLN
INTRAMUSCULAR | Status: DC | PRN
  Administered 2017-12-23: 8 mL via EPIDURAL

## 2017-12-23 MED ORDER — OXYCODONE-ACETAMINOPHEN 5-325 MG PO TABS
2.0000 | ORAL_TABLET | ORAL | Status: DC | PRN
  Administered 2017-12-24 – 2017-12-25 (×2): 2 via ORAL
  Filled 2017-12-23 (×2): qty 2

## 2017-12-23 MED ORDER — DEXAMETHASONE SODIUM PHOSPHATE 4 MG/ML IJ SOLN
INTRAMUSCULAR | Status: AC
  Filled 2017-12-23: qty 1

## 2017-12-23 MED ORDER — PHENYLEPHRINE HCL 10 MG/ML IJ SOLN
INTRAMUSCULAR | Status: DC | PRN
  Administered 2017-12-23 (×3): 80 ug via INTRAVENOUS
  Administered 2017-12-23: 40 ug via INTRAVENOUS

## 2017-12-23 MED ORDER — SIMETHICONE 80 MG PO CHEW
80.0000 mg | CHEWABLE_TABLET | ORAL | Status: DC | PRN
  Filled 2017-12-23: qty 1

## 2017-12-23 MED ORDER — MORPHINE SULFATE (PF) 0.5 MG/ML IJ SOLN
INTRAMUSCULAR | Status: DC | PRN
  Administered 2017-12-23: 3 mg via EPIDURAL

## 2017-12-23 MED ORDER — SIMETHICONE 80 MG PO CHEW
80.0000 mg | CHEWABLE_TABLET | Freq: Three times a day (TID) | ORAL | Status: DC
  Administered 2017-12-24 – 2017-12-25 (×3): 80 mg via ORAL
  Filled 2017-12-23 (×3): qty 1

## 2017-12-23 MED ORDER — CEFAZOLIN SODIUM-DEXTROSE 2-3 GM-%(50ML) IV SOLR
INTRAVENOUS | Status: DC | PRN
  Administered 2017-12-23: 2 g via INTRAVENOUS

## 2017-12-23 MED ORDER — FENTANYL CITRATE (PF) 100 MCG/2ML IJ SOLN
INTRAMUSCULAR | Status: AC
  Filled 2017-12-23: qty 2

## 2017-12-23 MED ORDER — DEXAMETHASONE SODIUM PHOSPHATE 4 MG/ML IJ SOLN
INTRAMUSCULAR | Status: DC | PRN
  Administered 2017-12-23: 4 mg via INTRAVENOUS

## 2017-12-23 MED ORDER — HYDROMORPHONE HCL 1 MG/ML IJ SOLN: 0.2500 mg | INTRAMUSCULAR | Status: DC | PRN

## 2017-12-23 MED ORDER — DIPHENHYDRAMINE HCL 25 MG PO CAPS: 25.0000 mg | ORAL_CAPSULE | Freq: Four times a day (QID) | ORAL | Status: DC | PRN

## 2017-12-23 MED ORDER — SODIUM BICARBONATE 8.4 % IV SOLN
INTRAVENOUS | Status: DC | PRN
  Administered 2017-12-23 (×2): 3 mL via EPIDURAL
  Administered 2017-12-23: 2 mL via EPIDURAL

## 2017-12-23 MED ORDER — MEPERIDINE HCL 25 MG/ML IJ SOLN
INTRAMUSCULAR | Status: AC
  Filled 2017-12-23: qty 1

## 2017-12-23 MED ORDER — NALOXONE HCL 0.4 MG/ML IJ SOLN: 0.4000 mg | INTRAMUSCULAR | Status: DC | PRN

## 2017-12-23 MED ORDER — PROMETHAZINE HCL 25 MG/ML IJ SOLN: 6.2500 mg | INTRAMUSCULAR | Status: DC | PRN

## 2017-12-23 MED ORDER — METHYLERGONOVINE MALEATE 0.2 MG/ML IJ SOLN: 0.2000 mg | INTRAMUSCULAR | Status: DC | PRN

## 2017-12-23 MED ORDER — LIDOCAINE-EPINEPHRINE (PF) 2 %-1:200000 IJ SOLN
INTRAMUSCULAR | Status: AC
  Filled 2017-12-23: qty 20

## 2017-12-23 MED ORDER — METOCLOPRAMIDE HCL 5 MG/ML IJ SOLN
INTRAMUSCULAR | Status: DC | PRN
  Administered 2017-12-23: 10 mg via INTRAVENOUS

## 2017-12-23 MED ORDER — NALBUPHINE HCL 10 MG/ML IJ SOLN: 5.0000 mg | Freq: Once | INTRAMUSCULAR | Status: DC | PRN

## 2017-12-23 MED ORDER — TRANEXAMIC ACID 1000 MG/10ML IV SOLN
1000.0000 mg | INTRAVENOUS | Status: AC
  Administered 2017-12-23: 1000 mg via INTRAVENOUS
  Filled 2017-12-23: qty 10

## 2017-12-23 MED ORDER — DIBUCAINE 1 % RE OINT: 1.0000 "application " | TOPICAL_OINTMENT | RECTAL | Status: DC | PRN

## 2017-12-23 MED ORDER — ZOLPIDEM TARTRATE 5 MG PO TABS: 5.0000 mg | ORAL_TABLET | Freq: Every evening | ORAL | Status: DC | PRN

## 2017-12-23 MED ORDER — SODIUM CHLORIDE 0.9% FLUSH: 3.0000 mL | INTRAVENOUS | Status: DC | PRN

## 2017-12-23 MED ORDER — TETANUS-DIPHTH-ACELL PERTUSSIS 5-2.5-18.5 LF-MCG/0.5 IM SUSP: 0.5000 mL | Freq: Once | INTRAMUSCULAR | Status: DC

## 2017-12-23 MED ORDER — MENTHOL 3 MG MT LOZG: 1.0000 | LOZENGE | OROMUCOSAL | Status: DC | PRN

## 2017-12-23 MED ORDER — FENTANYL CITRATE (PF) 100 MCG/2ML IJ SOLN
INTRAMUSCULAR | Status: DC | PRN
  Administered 2017-12-23: 100 ug via EPIDURAL

## 2017-12-23 MED ORDER — MEPERIDINE HCL 25 MG/ML IJ SOLN
INTRAMUSCULAR | Status: DC | PRN
  Administered 2017-12-23 (×2): 12.5 mg via INTRAVENOUS

## 2017-12-23 MED ORDER — BUPIVACAINE HCL (PF) 0.25 % IJ SOLN
INTRAMUSCULAR | Status: DC | PRN
  Administered 2017-12-23: 30 mL

## 2017-12-23 MED ORDER — DIPHENHYDRAMINE HCL 50 MG/ML IJ SOLN: 12.5000 mg | INTRAMUSCULAR | Status: DC | PRN

## 2017-12-23 MED ORDER — COCONUT OIL OIL: 1.0000 "application " | TOPICAL_OIL | Status: DC | PRN

## 2017-12-23 MED ORDER — SENNOSIDES-DOCUSATE SODIUM 8.6-50 MG PO TABS
2.0000 | ORAL_TABLET | ORAL | Status: DC
  Administered 2017-12-23 – 2017-12-24 (×2): 2 via ORAL
  Filled 2017-12-23 (×2): qty 2

## 2017-12-23 MED ORDER — PHENYLEPHRINE 40 MCG/ML (10ML) SYRINGE FOR IV PUSH (FOR BLOOD PRESSURE SUPPORT)
PREFILLED_SYRINGE | INTRAVENOUS | Status: AC
  Filled 2017-12-23: qty 10

## 2017-12-23 MED ORDER — MORPHINE SULFATE (PF) 0.5 MG/ML IJ SOLN
INTRAMUSCULAR | Status: AC
  Filled 2017-12-23: qty 10

## 2017-12-23 MED ORDER — SIMETHICONE 80 MG PO CHEW
80.0000 mg | CHEWABLE_TABLET | ORAL | Status: DC
  Administered 2017-12-23 – 2017-12-24 (×2): 80 mg via ORAL
  Filled 2017-12-23 (×2): qty 1

## 2017-12-23 MED ORDER — NALOXONE HCL 4 MG/10ML IJ SOLN
1.0000 ug/kg/h | INTRAVENOUS | Status: DC | PRN
  Filled 2017-12-23: qty 5

## 2017-12-23 MED ORDER — ONDANSETRON HCL 4 MG/2ML IJ SOLN
INTRAMUSCULAR | Status: AC
  Filled 2017-12-23: qty 2

## 2017-12-23 MED ORDER — DIPHENHYDRAMINE HCL 25 MG PO CAPS: 25.0000 mg | ORAL_CAPSULE | ORAL | Status: DC | PRN

## 2017-12-23 MED ORDER — LACTATED RINGERS IV SOLN
INTRAVENOUS | Status: DC | PRN
  Administered 2017-12-23: 10:00:00 via INTRAVENOUS

## 2017-12-23 MED ORDER — SCOPOLAMINE 1 MG/3DAYS TD PT72
MEDICATED_PATCH | TRANSDERMAL | Status: DC | PRN
  Administered 2017-12-23: 1 via TRANSDERMAL

## 2017-12-23 MED ORDER — SODIUM BICARBONATE 8.4 % IV SOLN
INTRAVENOUS | Status: AC
  Filled 2017-12-23: qty 50

## 2017-12-23 MED ORDER — ACETAMINOPHEN 10 MG/ML IV SOLN
INTRAVENOUS | Status: AC
  Filled 2017-12-23: qty 100

## 2017-12-23 MED ORDER — ACETAMINOPHEN 325 MG PO TABS: 650.0000 mg | ORAL_TABLET | ORAL | Status: DC | PRN

## 2017-12-23 MED ORDER — LEVOTHYROXINE SODIUM 100 MCG PO TABS
100.0000 ug | ORAL_TABLET | Freq: Every day | ORAL | Status: DC
  Administered 2017-12-24 – 2017-12-25 (×2): 100 ug via ORAL
  Filled 2017-12-23 (×3): qty 1

## 2017-12-23 MED ORDER — BUPIVACAINE HCL (PF) 0.25 % IJ SOLN
INTRAMUSCULAR | Status: AC
  Filled 2017-12-23: qty 30

## 2017-12-23 MED ORDER — IBUPROFEN 600 MG PO TABS
600.0000 mg | ORAL_TABLET | Freq: Four times a day (QID) | ORAL | Status: DC
  Administered 2017-12-23 – 2017-12-25 (×7): 600 mg via ORAL
  Filled 2017-12-23 (×7): qty 1

## 2017-12-23 MED ORDER — OXYTOCIN 10 UNIT/ML IJ SOLN
INTRAMUSCULAR | Status: AC
  Filled 2017-12-23: qty 4

## 2017-12-23 SURGICAL SUPPLY — 40 items
APL SKNCLS STERI-STRIP NONHPOA (GAUZE/BANDAGES/DRESSINGS) ×1
BENZOIN TINCTURE PRP APPL 2/3 (GAUZE/BANDAGES/DRESSINGS) ×1 IMPLANT
CHLORAPREP W/TINT 26ML (MISCELLANEOUS) ×2 IMPLANT
CLAMP CORD UMBIL (MISCELLANEOUS) ×1 IMPLANT
CLOSURE STERI STRIP 1/2 X4 (GAUZE/BANDAGES/DRESSINGS) ×1 IMPLANT
CLOTH BEACON ORANGE TIMEOUT ST (SAFETY) ×2 IMPLANT
DECANTER SPIKE VIAL GLASS SM (MISCELLANEOUS) ×3 IMPLANT
DRSG OPSITE POSTOP 4X10 (GAUZE/BANDAGES/DRESSINGS) ×2 IMPLANT
ELECT REM PT RETURN 9FT ADLT (ELECTROSURGICAL) ×2
ELECTRODE REM PT RTRN 9FT ADLT (ELECTROSURGICAL) ×1 IMPLANT
EXTRACTOR VACUUM M CUP 4 TUBE (SUCTIONS) IMPLANT
GLOVE BIO SURGEON STRL SZ7.5 (GLOVE) ×2 IMPLANT
GLOVE BIOGEL PI IND STRL 7.0 (GLOVE) ×1 IMPLANT
GLOVE BIOGEL PI INDICATOR 7.0 (GLOVE) ×1
GOWN STRL REUS W/TWL LRG LVL3 (GOWN DISPOSABLE) ×4 IMPLANT
KIT ABG SYR 3ML LUER SLIP (SYRINGE) IMPLANT
NDL HYPO 25X5/8 SAFETYGLIDE (NEEDLE) IMPLANT
NDL SPNL 20GX3.5 QUINCKE YW (NEEDLE) IMPLANT
NEEDLE HYPO 22GX1.5 SAFETY (NEEDLE) ×2 IMPLANT
NEEDLE HYPO 25X5/8 SAFETYGLIDE (NEEDLE) IMPLANT
NEEDLE SPNL 20GX3.5 QUINCKE YW (NEEDLE) IMPLANT
NS IRRIG 1000ML POUR BTL (IV SOLUTION) ×2 IMPLANT
PACK C SECTION WH (CUSTOM PROCEDURE TRAY) ×2 IMPLANT
PAD ABD 7.5X8 STRL (GAUZE/BANDAGES/DRESSINGS) ×2 IMPLANT
PAD ABD DERMACEA PRESS 5X9 (GAUZE/BANDAGES/DRESSINGS) ×1 IMPLANT
PENCIL SMOKE EVAC W/HOLSTER (ELECTROSURGICAL) ×2 IMPLANT
SUT MNCRL 0 VIOLET CTX 36 (SUTURE) ×2 IMPLANT
SUT MNCRL AB 3-0 PS2 27 (SUTURE) IMPLANT
SUT MON AB 2-0 CT1 27 (SUTURE) ×2 IMPLANT
SUT MON AB-0 CT1 36 (SUTURE) ×4 IMPLANT
SUT MONOCRYL 0 CTX 36 (SUTURE) ×3
SUT PLAIN 0 NONE (SUTURE) IMPLANT
SUT PLAIN 2 0 (SUTURE)
SUT PLAIN 2 0 XLH (SUTURE) IMPLANT
SUT PLAIN ABS 2-0 CT1 27XMFL (SUTURE) IMPLANT
SUT VIC AB 4-0 KS 27 (SUTURE) ×1 IMPLANT
SYR 20CC LL (SYRINGE) IMPLANT
SYR CONTROL 10ML LL (SYRINGE) ×2 IMPLANT
TOWEL OR 17X24 6PK STRL BLUE (TOWEL DISPOSABLE) ×2 IMPLANT
TRAY FOLEY W/BAG SLVR 14FR LF (SET/KITS/TRAYS/PACK) ×2 IMPLANT

## 2017-12-23 NOTE — Anesthesia Postprocedure Evaluation (Signed)
Anesthesia Post Note  Patient: Kalianna B Grieser  Procedure(s) Performed: CESAREAN SECTION (N/A )     Patient location during evaluation: Mother Baby Anesthesia Type: Epidural Level of consciousness: awake and alert Pain management: pain level controlled Vital Signs Assessment: post-procedure vital signs reviewed and stable Respiratory status: spontaneous breathing, nonlabored ventilation and respiratory function stable Cardiovascular status: stable Postop Assessment: no headache, no backache and epidural receding Anesthetic complications: no    Last Vitals:  Vitals:   12/23/17 1305 12/23/17 1410  BP: 111/68 106/63  Pulse: 70 (!) 58  Resp: 16 17  Temp: 36.9 C 37.6 C  SpO2: 97% 96%    Last Pain:  Vitals:   12/23/17 1410  TempSrc: Oral  PainSc: 0-No pain   Pain Goal: Patients Stated Pain Goal: 3 (12/23/17 1331)               Trevor IhaStephen A Dequavious Harshberger

## 2017-12-23 NOTE — Transfer of Care (Signed)
Immediate Anesthesia Transfer of Care Note  Patient: Marissa [name]  Procedure(s) Performed: CESAREAN SECTION (N/A )  Patient Location: PACU  Anesthesia Type:Epidural  Level of Consciousness: awake, alert  and oriented  Airway & Oxygen Therapy: Patient Spontanous Breathing  Post-op Assessment: Report given to RN and Post -op Vital signs reviewed and stable  Post vital signs: Reviewed and stable  Last Vitals:  Vitals Value Taken Time  BP    Temp    Pulse    Resp    SpO2      Last Pain:  Vitals:   12/23/17 0802  TempSrc: Oral  PainSc:       Patients Stated Pain Goal: 2 (12/23/17 0701)  Complications: No apparent anesthesia complications

## 2017-12-23 NOTE — Addendum Note (Signed)
Addendum  created 12/23/17 1946 by Junious SilkGilbert, Mialee Weyman, CRNA   Sign clinical note

## 2017-12-23 NOTE — Anesthesia Postprocedure Evaluation (Signed)
Anesthesia Post Note  Patient: Marissa [name]  Procedure(s) Performed: CESAREAN SECTION (N/A )     Patient location during evaluation: Mother Baby Anesthesia Type: Epidural Level of consciousness: awake and alert Pain management: pain level controlled Vital Signs Assessment: post-procedure vital signs reviewed and stable Respiratory status: spontaneous breathing, nonlabored ventilation and respiratory function stable Cardiovascular status: stable Postop Assessment: no headache, no backache and epidural receding Anesthetic complications: no    Last Vitals:  Vitals:   12/23/17 1710 12/23/17 1831  BP:    Pulse: 72   Resp:  18  Temp:  37.1 C  SpO2: 97% 98%    Last Pain:  Vitals:   12/23/17 1844  TempSrc:   PainSc: 1    Pain Goal: Patients Stated Pain Goal: 3 (12/23/17 1844)               Junious SilkGILBERT,Chanda Laperle

## 2017-12-23 NOTE — Progress Notes (Signed)
Marissa [name] is a 42 y.o. G4P1021 at 4759w3d by LMP admitted for induction of labor due to [facility]MA and LGA.  Subjective: Comfortable  Objective: BP 115/70   Pulse 82   Temp 98 F (36.7 C) (Oral)   Resp 14   Ht 5\' 6"  (1.676 m)   Wt 109.3 kg   BMI 38.90 kg/m  No intake/output data recorded. Total I/O In: 3336.2 [P.O.:150; I.V.:3186.2] Out: 1175 [Urine:1175]  FHT:  FHR: 145 bpm, variability: moderate,  accelerations:  Present,  decelerations:  Absent UC:   regular, every 2-4 minutes SVE:   Dilation: 5 Effacement (%): 70 Station: -1 Exam by:: L.Stubbs, RN  Labs: Lab Results  Component Value Date   WBC 7.7 12/22/2017   HGB 11.4 (L) 12/22/2017   HCT 34.4 (L) 12/22/2017   MCV 79.1 12/22/2017   PLT 232 12/22/2017    Assessment / Plan: Protracted active phase  Labor: slow progress Preeclampsia:  no signs or symptoms of toxicity Fetal Wellbeing:  Category I Pain Control:  Epidural I/D:  n/a Anticipated MOD:  guarded  Marissa [name] 12/23/2017, 6:24 AM

## 2017-12-23 NOTE — Progress Notes (Signed)
Yumiko B Monday is a 42 y.o. G4P1021 at 298w3d by LMP [facility].  Subjective: Comfortable  Objective: BP 131/81   Pulse 86   Temp 99 F (37.2 C) (Oral)   Resp 18   Ht 5\' 6"  (1.676 m)   Wt 109.3 kg   BMI 38.90 kg/m  I/O last 3 completed shifts: In: 3336.2 [P.O.:150; I.V.:3186.2] Out: 1175 [Urine:1175] No intake/output data recorded.  FHT:  FHR: 145 bpm, variability: moderate,  accelerations:  Present,  decelerations:  Absent UC:   regular, every 2-4 minutes SVE:   5/80/-1  Labs: Lab Results  Component Value Date   WBC 7.7 12/22/2017   HGB 11.4 (L) 12/22/2017   HCT 34.4 (L) 12/22/2017   MCV 79.1 12/22/2017   PLT 232 12/22/2017    Assessment / Plan: Protracted active phase- active phase arrest  Labor: slow progress Preeclampsia:  no signs or symptoms of toxicity Fetal Wellbeing:  Category I Pain Control:  Epidural I/D:  n/a Anticipated MOD:  Proceed with csection. Consent done  Sanvika Cuttino J 12/23/2017, 9:03 AM

## 2017-12-23 NOTE — Op Note (Signed)
Cesarean Section Procedure Note  Indications: failure to progress: arrest of dilation  Pre-operative Diagnosis: 39 week 2 day pregnancy.  Post-operative Diagnosis: same  Surgeon: Lenoard AdenAAVON,Thailan Sava J   Assistants: Renae FicklePaul, CNM  Anesthesia: Epidural anesthesia and Local anesthesia 0.25.% bupivacaine  ASA Class: 2  Procedure Details  The patient was seen in the Holding Room. The risks, benefits, complications, treatment options, and expected outcomes were discussed with the patient.  The patient concurred with the proposed plan, giving informed consent. The risks of anesthesia, infection, bleeding and possible injury to other organs discussed. Injury to bowel, bladder, or ureter with possible need for repair discussed. Possible need for transfusion with secondary risks of hepatitis or HIV acquisition discussed. Post operative complications to include but not limited to DVT, PE and Pneumonia noted. The site of surgery properly noted/marked. The patient was taken to Operating Room # 9, identified as Macel B Porcelli and the procedure verified as C-Section Delivery. A Time Out was held and the above information confirmed.  After induction of anesthesia, the patient was draped and prepped in the usual sterile manner. A Pfannenstiel incision was made and carried down through the subcutaneous tissue to the fascia. Fascial incision was made and extended transversely using Mayo scissors. The fascia was separated from the underlying rectus tissue superiorly and inferiorly. The peritoneum was identified and entered. Peritoneal incision was extended longitudinally. The utero-vesical peritoneal reflection was incised transversely and the bladder flap was bluntly freed from the lower uterine segment. A low transverse uterine incision(Kerr hysterotomy) was made. Delivered from OP presentation was a  female with Apgar scores of 8 at one minute and 8 at five minutes. Bulb suctioning gently performed. Neonatal team in  attendance.After the umbilical cord was clamped and cut cord blood was obtained for evaluation. The placenta was removed intact and appeared normal. The uterus was curetted with a dry lap pack. Good hemostasis was noted.The uterine outline, tubes and ovaries appeared normal. The uterine incision was closed with running locked sutures of 0 Monocryl x 2 layers.  Good hemostasis.The parietal peritoneum was closed with a running 2-0 Monocryl suture. The fascia was then reapproximated with running sutures of 0 Monocryl. The skin was reapproximated with 4-0 vicryl after Wallace closure 2-0 plain.  Instrument, sponge, and needle counts were correct prior the abdominal closure and at the conclusion of the case.   Findings: FTLM, LGA, OP  Estimated Blood Loss:  600         Drains: foley                 Specimens: placenta                 Complications:  None; patient tolerated the procedure well.         Disposition: PACU - hemodynamically stable.         Condition: stable  Attending Attestation: I performed the procedure.

## 2017-12-23 NOTE — Lactation Note (Addendum)
This note was copied from a baby's chart. Lactation Consultation Note  Patient Name: Marissa [name] Today's Date: 12/23/2017 Reason for consult: Initial assessment;Other (Comment);Term(P2 , per mom BF 1st baby 10 months / per mom [employer] - [employer] plan - will need her DEBP before D/C , will go on [employer] )  Baby is 499 hours old  Baby has been to the breast x 4 prior to seeing mom.  As LC entered the  Room baby latched with depth, multiple swallows noted and mom comfortable.  Baby still feeding at 10 mins. Baby wrapped in blanket feeding. LC discussed how important  STS feedings are and it helps the baby stay awake , so he will be nutritive.  Per mom - has [employer] plan insurance and was asking about her DEBP insurance benefit.  LC recommended going on [employer].com and it will help her decide what one to choose.  Mom is an experienced breast feeder and feels breast feeding is going well.  Mother informed of post-discharge support and given phone number to the lactation department, including services for phone call assistance; out-patient appointments; and breastfeeding support group. List of other breastfeeding resources in the community given in the handout. Encouraged mother to call for problems or concerns related to breastfeeding.    Maternal Data Has patient been taught Hand Expression?: Yes(permom familiar and has been shown by her RN ) Does the patient have breastfeeding experience prior to this delivery?: Yes  Feeding - ( Latch Score by the RN )  Feeding Type: (baby latched with depth, swallows noted , still feeding ) Length of feed: (swallows noted, mom comfortable )  LATCH Score Latch: Grasps breast easily, tongue down, lips flanged, rhythmical sucking.  Audible Swallowing: A few with stimulation  Type of Nipple: Everted at rest and after stimulation  Comfort (Breast/Nipple): Soft / non-tender  Hold (Positioning): Assistance needed to correctly position infant at  breast and maintain latch.(FOB assisted mother)  LATCH Score: 8  Interventions    Lactation Tools Discussed/Used WIC Program: No   Consult Status F/U 8/30 /2019       Marissa [name] 12/23/2017, 7:32 PM

## 2017-12-23 NOTE — Progress Notes (Signed)
Marissa [name] is a 42 y.o. G4P1021 at 7674w3d by LMP admitted [facility] labor [facility].  Subjective: Comfortable Objective: BP 115/70   Pulse 82   Temp 98 F (36.7 C) (Oral)   Resp 14   Ht 5\' 6"  (1.676 m)   Wt 109.3 kg   BMI 38.90 kg/m  No intake/output data recorded. Total I/O In: 3336.2 [P.O.:150; I.V.:3186.2] Out: 1175 [Urine:1175]  FHT:  FHR: 145 bpm, variability: moderate,  accelerations:  Present,  decelerations:  Absent UC:   regular, every 2-4 minutes SVE:   Dilation: 5 Effacement (%): 70 Station: -1 Exam by:: L.Stubbs, RN  Labs: Lab Results  Component Value Date   WBC 7.7 12/22/2017   HGB 11.4 (L) 12/22/2017   HCT 34.4 (L) 12/22/2017   MCV 79.1 12/22/2017   PLT 232 12/22/2017    Assessment / Plan: Induction of labor due to AMA and LGA,  progressing well on pitocin  Labor: Progressing normally Preeclampsia:  no signs or symptoms of toxicity Fetal Wellbeing:  Category I Pain Control:  Epidural I/D:  n/a Anticipated MOD:  gurarde  Dartanyon Frankowski J 12/23/2017, 6:23 AM

## 2017-12-24 ENCOUNTER — Encounter (HOSPITAL_COMMUNITY): Payer: Self-pay | Admitting: Obstetrics and Gynecology

## 2017-12-24 LAB — CBC
HCT: 25 % — ABNORMAL LOW (ref 36.0–46.0)
Hemoglobin: 8.3 g/dL — ABNORMAL LOW (ref 12.0–15.0)
MCH: 26 pg (ref 26.0–34.0)
MCHC: 33.2 g/dL (ref 30.0–36.0)
MCV: 78.4 fL (ref 78.0–100.0)
PLATELETS: 171 10*3/uL (ref 150–400)
RBC: 3.19 MIL/uL — ABNORMAL LOW (ref 3.87–5.11)
RDW: 14.4 % (ref 11.5–15.5)
WBC: 12.5 10*3/uL — AB (ref 4.0–10.5)

## 2017-12-24 LAB — BIRTH TISSUE RECOVERY COLLECTION (PLACENTA DONATION)

## 2017-12-24 MED ORDER — POLYSACCHARIDE IRON COMPLEX 150 MG PO CAPS
150.0000 mg | ORAL_CAPSULE | Freq: Every day | ORAL | Status: DC
  Administered 2017-12-24 – 2017-12-25 (×2): 150 mg via ORAL
  Filled 2017-12-24 (×2): qty 1

## 2017-12-24 MED ORDER — MAGNESIUM OXIDE 400 (241.3 MG) MG PO TABS
400.0000 mg | ORAL_TABLET | Freq: Every day | ORAL | Status: DC
  Administered 2017-12-24 – 2017-12-25 (×2): 400 mg via ORAL
  Filled 2017-12-24 (×3): qty 1

## 2017-12-24 NOTE — Progress Notes (Signed)
Patient ID: Marissa [name], female   DOB: 12/01/1975, 42 y.o.   MRN: [id number]  Post Partum Day # 1, s/p primary cesarean section for failure to progress: arrest of dilation  Subjective:  Patient sitting in bed, no concerns. Eating and drinking without difficulty. Negative flatus. Infant sleeping in bassinet at bedside.   Denies difficulty breathing or respiratory distress, chest pain, abdominal pain, excessive vaginal bleeding, dysuria, and leg pain.   Objective:  Temp:  [98 F (36.7 C)-99.7 F (37.6 C)] 98 F (36.7 C) (08/30 0610) Pulse Rate:  [58-108] 64 (08/30 0610) Resp:  [12-28] 18 (08/30 0610) BP: (93-131)/(60-83) 116/72 (08/30 0610) SpO2:  [91 %-99 %] 98 % (08/29 1831)  Physical Exam:   General: alert and cooperative   Lungs: clear to auscultation bilaterally  Breasts: deferred, no complaints  Heart: normal apical impulse  Abdomen: soft, non-tender; bowel sounds normal; no masses,  no organomegaly; incision well approximated and covered by dressing-clean, dry intact  Pelvis: Lochia: appropriate, Uterine Fundus: firm  Extremities: DVT Evaluation: No evidence of DVT seen on physical exam. Mild Calf/Ankle edema is present.  Recent Labs    12/22/17 0747 12/24/17 0622  HGB 11.4* 8.3*  HCT 34.4* 25.0*    Assessment:  42 year old G4P2, postpartum day #1, status post primary cesarean section for failure to progress: arrest of dilation, Rh positive, Blood loss anemia  Breastfeeding  Plan:  Iron and Magnesium supplementation, see orders.   Pressure dressing removed. Incision well approximated and covered by honeycomb.   Routine postpartum care and education.   Reviewed red flag symptoms and when to call.  Continue orders as written. Reassess as needed.    LOS: 2 days    Gunnar BullaJenkins Michelle Dawne Casali, CNM [facility] 12/24/2017 [id number]

## 2017-12-24 NOTE — Progress Notes (Signed)
CSW received consult due to score of 10 on the Edinburgh Depression Screen.  She scored a 1 on question 10, indicating rare feelings of self harm.   MOB was calm, quiet and extremely pleasant and easy to engage.  She held baby and explained to CSW that baby and her 43 year old daughter were both conceived by IVF and that she experienced symptoms of depression in the years between when she had two miscarriages.  She states she couldn't allow herself to become too depressed because of needing to stay strong for her daughter.  She reports feeling well now with no emotional concerns during pregnancy.  She told CSW that she was depressed for the first two weeks after her daughter was born and then felt fine.  CSW normalized this and provided education regarding Baby Blues vs PMADs.  CSW encouraged MOB to evaluate her mental health throughout the postpartum period with the use of the the Edinburgh Postnatal Depression Scale and notify a medical professional if symptoms arise.  CSW asked MOB about her response to question 10 and she states she would never hurt herself.  She states her faith would prevent her from doing so.  She easily contracted for safety.  CSW identifies no further need for intervention and no barriers to discharge.   

## 2017-12-25 DIAGNOSIS — E039 Hypothyroidism, unspecified: Secondary | ICD-10-CM | POA: Diagnosis present

## 2017-12-25 DIAGNOSIS — O9902 Anemia complicating childbirth: Secondary | ICD-10-CM | POA: Diagnosis present

## 2017-12-25 MED ORDER — SENNOSIDES-DOCUSATE SODIUM 8.6-50 MG PO TABS: 2.0000 | ORAL_TABLET | ORAL | Status: AC

## 2017-12-25 MED ORDER — POLYSACCHARIDE IRON COMPLEX 150 MG PO CAPS: 150.0000 mg | ORAL_CAPSULE | Freq: Every day | ORAL | 0 refills | Status: AC

## 2017-12-25 MED ORDER — ACETAMINOPHEN 325 MG PO TABS: 650.0000 mg | ORAL_TABLET | ORAL | Status: AC | PRN

## 2017-12-25 MED ORDER — OXYCODONE-ACETAMINOPHEN 5-325 MG PO TABS: 1.0000 | ORAL_TABLET | ORAL | 0 refills | Status: AC | PRN

## 2017-12-25 MED ORDER — SIMETHICONE 80 MG PO CHEW: 80.0000 mg | CHEWABLE_TABLET | ORAL | 0 refills | Status: AC | PRN

## 2017-12-25 MED ORDER — HYDROCHLOROTHIAZIDE 12.5 MG PO CAPS: 12.5000 mg | ORAL_CAPSULE | Freq: Every day | ORAL | 0 refills | Status: AC

## 2017-12-25 MED ORDER — COCONUT OIL OIL: 1.0000 "application " | TOPICAL_OIL | 0 refills | Status: AC | PRN

## 2017-12-25 MED ORDER — MAGNESIUM OXIDE 400 (241.3 MG) MG PO TABS: 400.0000 mg | ORAL_TABLET | Freq: Every day | ORAL | Status: AC

## 2017-12-25 MED ORDER — IBUPROFEN 600 MG PO TABS: 600.0000 mg | ORAL_TABLET | Freq: Four times a day (QID) | ORAL | 0 refills | Status: AC

## 2017-12-25 NOTE — Discharge Instructions (Signed)
For swelling: nettle and dandelion tea 2-3 cups / day, maintain hydration 8-10 cups water / day minimal °

## 2017-12-25 NOTE — Lactation Note (Signed)
This note was copied from a baby's chart. Lactation Consultation Note  Patient Name: Marissa [name] Today's Date: 12/25/2017 Reason for consult: Follow-up assessment   Baby in nursery for circ.  Mother denies questions or concerns.  States baby is doing well with breastfeeding. Mom encouraged to feed baby 8-12 times/24 hours and with feeding cues. 'Reviewed engorgement care and monitoring voids/stools. Provided mother w/  Employee DEBP.    Maternal Data    Feeding Feeding Type: Breast Fed Length of feed: 20 min  LATCH Score                   Interventions    Lactation Tools Discussed/Used     Consult Status Consult Status: Complete Date: 12/26/17    Dahlia ByesBerkelhammer, Ruth [facility] 12/25/2017, 10:05 AM

## 2017-12-25 NOTE — Discharge Summary (Signed)
OB Discharge Summary     Patient Name: Marissa [name] DOB: 1975-06-21 MRN: [id number]  Date of admission: 12/22/2017 Delivering MD: Olivia Mackie   Date of discharge: 12/25/2017  Admitting diagnosis: INDUCTION Intrauterine pregnancy: [redacted]w[redacted]d     Secondary diagnosis:  Principal Problem:   1C/S - Indication: AOD/macrosomia 8/29 Active Problems:   Encounter for planned induction of labor   Postpartum care following cesarean delivery   Maternal anemia, with delivery   Hypothyroidism      Discharge diagnosis:  Patient Active Problem List   Diagnosis Date Noted  . Maternal anemia, with delivery 12/25/2017  . Hypothyroidism 12/25/2017  . 1C/S - Indication: AOD/macrosomia 8/29 12/23/2017  . Postpartum care following cesarean delivery 12/23/2017  . Encounter for planned induction of labor 12/22/2017                                                                                                Post partum procedures: NONE  Augmentation: AROM and Pitocin  Complications: None  Hospital course:  Induction of Labor With Cesarean Section  42 y.o. yo [id number] at [redacted]w[redacted]d was admitted to the hospital 12/22/2017 for induction of labor. Patient had a labor course significant for arrest of dilation at 5 cm. The patient went for cesarean section due to Arrest of Dilation, and delivered a Viable infant,12/23/2017  Membrane Rupture Time/Date: 1:27 PM ,12/22/2017   Details of operation can be found in separate operative Note.  Patient had an uncomplicated postpartum course. She is ambulating, tolerating a regular diet, passing flatus, and urinating well.  Patient is discharged home in stable condition on 12/25/17.                                    Physical exam  Vitals:   12/24/17 0610 12/24/17 1443 12/24/17 2255 12/25/17 0540  BP: 116/72 119/66 106/66 118/74  Pulse: 64 62 87 80  Resp: 18 18 18 18   Temp: 98 F (36.7 C) 97.7 F (36.5 C) 98 F (36.7 C) 98.2 F (36.8 C)  TempSrc: Oral Oral  Oral Oral  SpO2:  100%    Weight:      Height:       General: alert, cooperative and no distress Lochia: appropriate Uterine Fundus: firm Incision: Healing well with no significant drainage DVT Evaluation: No cords or calf tenderness. Calf/Ankle edema is present Labs: Lab Results  Component Value Date   WBC 12.5 (H) 12/24/2017   HGB 8.3 (L) 12/24/2017   HCT 25.0 (L) 12/24/2017   MCV 78.4 12/24/2017   PLT 171 12/24/2017   CMP Latest Ref Rng & Units 08/08/2015  Glucose 65 - 99 mg/dL 147(W)  BUN 6 - 20 mg/dL 9  Creatinine 2.95 - 6.21 mg/dL 3.08(M)  Sodium 578 - 469 mmol/L 138  Potassium 3.5 - 5.1 mmol/L 3.8  Chloride 101 - 111 mmol/L 102  CO2 22 - 32 mmol/L 25  Calcium 8.9 - 10.3 mg/dL 9.0  Total Protein 6.5 - 8.1 g/dL 6.8  Total Bilirubin 0.3 -  1.2 mg/dL 0.7  Alkaline Phos 38 - 126 U/L 51  AST 15 - 41 U/L 17  ALT 14 - 54 U/L 11(L)    Discharge instruction: per After Visit Summary and "Baby and Me Booklet".  After visit meds:  Allergies as of 12/25/2017      Reactions   Sulfa Antibiotics Hives      Medication List    STOP taking these medications   aspirin EC 81 MG tablet     TAKE these medications   acetaminophen 325 MG tablet Commonly known as:  TYLENOL Take 2 tablets (650 mg total) by mouth every 4 (four) hours as needed (for pain scale < 4).   cholecalciferol 1000 units tablet Commonly known as:  VITAMIN D Take 1,000 Units by mouth daily.   coconut oil Oil Apply 1 application topically as needed.   hydrochlorothiazide 12.5 MG capsule Commonly known as:  MICROZIDE Take 1 capsule (12.5 mg total) by mouth daily for 7 doses.   ibuprofen 600 MG tablet Commonly known as:  ADVIL,MOTRIN Take 1 tablet (600 mg total) by mouth every 6 (six) hours.   iron polysaccharides 150 MG capsule Commonly known as:  NIFEREX Take 1 capsule (150 mg total) by mouth daily.   levothyroxine 100 MCG tablet Commonly known as:  SYNTHROID, LEVOTHROID Take 100 mcg by mouth  daily before breakfast.   magnesium oxide 400 (241.3 Mg) MG tablet Commonly known as:  MAG-OX Take 1 tablet (400 mg total) by mouth daily.   oxyCODONE-acetaminophen 5-325 MG tablet Commonly known as:  PERCOCET/ROXICET Take 1 tablet by mouth every 4 (four) hours as needed (pain scale 4-7).   prenatal multivitamin Tabs tablet Take 1 tablet by mouth every morning.   senna-docusate 8.6-50 MG tablet Commonly known as:  Senokot-S Take 2 tablets by mouth daily. Start taking on:  12/26/2017   simethicone 80 MG chewable tablet Commonly known as:  MYLICON Chew 1 tablet (80 mg total) by mouth as needed for flatulence.       Diet: routine diet  Activity: Advance as tolerated. Pelvic rest for 6 weeks.   Outpatient follow up:  Follow-up Information    Olivia Mackieaavon, Richard, MD. Schedule an appointment as soon as possible for a visit in 6 week(s).   Specialty:  Obstetrics and Gynecology Contact information: [address] [place] [address] [phone]          Postpartum contraception: Not Discussed  Newborn Data: Live born female Jacquenette ShoneJulian Birth Weight: 9 lb 10.3 oz (4375 g) APGAR: 9, 9  Newborn Delivery   Birth date/time:  12/23/2017 09:41:00 Delivery type:  C-Section, Low Transverse Trial of labor:  Yes C-section categorization:  Primary     Baby Feeding: Breast Disposition:home with mother   12/25/2017 Neta Mendsaniela C Ishitha Roper, CNM

## 2017-12-27 ENCOUNTER — Inpatient Hospital Stay (HOSPITAL_COMMUNITY): Admission: AD | Admit: 2017-12-27 | Payer: 59 | Source: Ambulatory Visit | Admitting: Obstetrics and Gynecology

## 2017-12-28 MED FILL — SYNTHROID 100 MCG TABLET: 100 | 28 days supply | Qty: 38 | Fill #1

## 2018-01-16 ENCOUNTER — Emergency Department (HOSPITAL_COMMUNITY): Payer: No Typology Code available for payment source

## 2018-01-16 ENCOUNTER — Emergency Department (HOSPITAL_COMMUNITY)
Admission: EM | Admit: 2018-01-16 | Discharge: 2018-01-16 | Disposition: A | Discharge status: Home / Self Care | Payer: No Typology Code available for payment source | Attending: Emergency Medicine | Admitting: Emergency Medicine

## 2018-01-16 ENCOUNTER — Encounter: Payer: Self-pay | Admitting: Emergency Medicine

## 2018-01-16 DIAGNOSIS — S199XXA Unspecified injury of neck, initial encounter: Secondary | ICD-10-CM | POA: Diagnosis present

## 2018-01-16 DIAGNOSIS — Y9389 Activity, other specified: Secondary | ICD-10-CM | POA: Diagnosis not present

## 2018-01-16 DIAGNOSIS — S134XXA Sprain of ligaments of cervical spine, initial encounter: Secondary | ICD-10-CM | POA: Diagnosis not present

## 2018-01-16 DIAGNOSIS — R51 Headache: Secondary | ICD-10-CM | POA: Diagnosis not present

## 2018-01-16 DIAGNOSIS — Y92410 Unspecified street and highway as the place of occurrence of the external cause: Secondary | ICD-10-CM | POA: Diagnosis not present

## 2018-01-16 DIAGNOSIS — Y999 Unspecified external cause status: Secondary | ICD-10-CM | POA: Diagnosis not present

## 2018-01-16 DIAGNOSIS — Z87891 Personal history of nicotine dependence: Secondary | ICD-10-CM | POA: Diagnosis not present

## 2018-01-16 DIAGNOSIS — E039 Hypothyroidism, unspecified: Secondary | ICD-10-CM | POA: Insufficient documentation

## 2018-01-16 DIAGNOSIS — Z79899 Other long term (current) drug therapy: Secondary | ICD-10-CM | POA: Diagnosis not present
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
## 2018-02-04 MED FILL — SYNTHROID 100 MCG TABLET: 100 | 28 days supply | Qty: 38 | Fill #2

## 2018-03-16 MED FILL — SYNTHROID 100 MCG TABLET: 100 | 30 days supply | Qty: 38 | Fill #0

## 2018-04-22 MED FILL — SYNTHROID 100 MCG TABLET: 100 | 90 days supply | Qty: 90 | Fill #1

## 2018-07-28 MED FILL — SYNTHROID 100 MCG TABLET: 100 | 30 days supply | Qty: 30 | Fill #0

## 2018-08-25 MED FILL — SYNTHROID 100 MCG TABLET: 100 | 30 days supply | Qty: 30 | Fill #1 | Status: TO

## 2018-09-22 MED FILL — SYNTHROID 100 MCG TABLET: 100 | 30 days supply | Qty: 30 | Fill #0
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
## 2018-10-25 MED FILL — SYNTHROID 100 MCG TABLET: 100 | 30 days supply | Qty: 30 | Fill #1

## 2018-11-23 MED FILL — SYNTHROID 100 MCG TABLET: 100 | 30 days supply | Qty: 30 | Fill #2

## 2018-12-15 ENCOUNTER — Other Ambulatory Visit: Payer: Self-pay | Admitting: Internal Medicine

## 2018-12-15 ENCOUNTER — Ambulatory Visit
Admission: RE | Admit: 2018-12-15 | Discharge: 2018-12-15 | Disposition: A | Discharge status: Home / Self Care | Payer: No Typology Code available for payment source | Source: Ambulatory Visit | Attending: Internal Medicine | Admitting: Internal Medicine

## 2018-12-15 DIAGNOSIS — M5416 Radiculopathy, lumbar region: Secondary | ICD-10-CM

## 2018-12-15 MED FILL — predniSONE 10 MG TABS: 10 | 10 days supply | Qty: 45 | Fill #0

## 2018-12-27 MED FILL — SYNTHROID 100 MCG TABLET: 100 | 30 days supply | Qty: 30 | Fill #3

## 2019-01-27 MED FILL — SYNTHROID 100 MCG TABLET: 100 | 30 days supply | Qty: 30 | Fill #4

## 2019-03-03 MED FILL — SYNTHROID 100 MCG TABLET: 100 | 30 days supply | Qty: 30 | Fill #5

## 2019-04-10 MED FILL — SYNTHROID 100 MCG TABLET: 100 | 90 days supply | Qty: 90 | Fill #0

## 2019-07-12 MED FILL — SYNTHROID 100 MCG TABLET: 100 | 90 days supply | Qty: 90 | Fill #1

## 2019-10-13 MED FILL — SYNTHROID 100 MCG TABLET: 100 | 90 days supply | Qty: 90 | Fill #0

## 2020-01-17 MED FILL — SYNTHROID 100 MCG TABLET: 100 | 90 days supply | Qty: 90 | Fill #1

## 2020-04-12 ENCOUNTER — Other Ambulatory Visit (HOSPITAL_COMMUNITY): Payer: Self-pay | Admitting: Internal Medicine

## 2020-04-12 MED FILL — SYNTHROID 100 MCG TABLET: 100 | 90 days supply | Qty: 90 | Fill #0

## 2020-07-22 MED FILL — SYNTHROID 100 MCG TABLET: 100 | 90 days supply | Qty: 90 | Fill #1

## 2020-08-22 ENCOUNTER — Other Ambulatory Visit (HOSPITAL_COMMUNITY): Payer: Self-pay

## 2020-10-23 ENCOUNTER — Other Ambulatory Visit (HOSPITAL_COMMUNITY): Payer: Self-pay

## 2020-10-23 MED FILL — Levothyroxine Sodium Tab 100 MCG: ORAL | 90 days supply | Qty: 90 | Fill #0 | Status: AC

## 2020-11-13 ENCOUNTER — Other Ambulatory Visit (HOSPITAL_COMMUNITY): Payer: Self-pay

## 2021-01-27 ENCOUNTER — Other Ambulatory Visit (HOSPITAL_COMMUNITY): Payer: Self-pay

## 2021-01-27 MED FILL — Levothyroxine Sodium Tab 100 MCG: ORAL | 90 days supply | Qty: 90 | Fill #1 | Status: AC

## 2021-03-05 ENCOUNTER — Other Ambulatory Visit (HOSPITAL_COMMUNITY): Payer: Self-pay

## 2021-03-05 MED ORDER — AMOXICILLIN-POT CLAVULANATE 875-125 MG PO TABS
1.0000 | ORAL_TABLET | Freq: Two times a day (BID) | ORAL | 0 refills | Status: AC
  Filled 2021-03-05: qty 10, 5d supply, fill #0

## 2021-05-07 ENCOUNTER — Other Ambulatory Visit (HOSPITAL_COMMUNITY): Payer: Self-pay

## 2021-05-09 ENCOUNTER — Other Ambulatory Visit (HOSPITAL_COMMUNITY): Payer: Self-pay

## 2021-05-12 ENCOUNTER — Other Ambulatory Visit (HOSPITAL_COMMUNITY): Payer: Self-pay

## 2021-05-13 ENCOUNTER — Other Ambulatory Visit (HOSPITAL_COMMUNITY): Payer: Self-pay

## 2021-05-13 MED ORDER — LEVOTHYROXINE SODIUM 100 MCG PO TABS
110.0000 ug | ORAL_TABLET | Freq: Every day | ORAL | 3 refills | Status: DC
  Filled 2021-05-13: qty 90, 90d supply, fill #0
  Filled 2021-08-18: qty 90, 90d supply, fill #1
  Filled 2021-11-21: qty 90, 90d supply, fill #2
  Filled 2022-02-09: qty 90, 90d supply, fill #3

## 2021-05-15 ENCOUNTER — Other Ambulatory Visit (HOSPITAL_COMMUNITY): Payer: Self-pay

## 2021-08-18 ENCOUNTER — Other Ambulatory Visit (HOSPITAL_COMMUNITY): Payer: Self-pay

## 2021-08-20 ENCOUNTER — Other Ambulatory Visit (HOSPITAL_COMMUNITY): Payer: Self-pay

## 2021-08-29 ENCOUNTER — Other Ambulatory Visit: Payer: Self-pay | Admitting: Obstetrics and Gynecology

## 2021-08-29 DIAGNOSIS — E041 Nontoxic single thyroid nodule: Secondary | ICD-10-CM

## 2021-09-09 ENCOUNTER — Ambulatory Visit
Admission: RE | Admit: 2021-09-09 | Discharge: 2021-09-09 | Disposition: A | Discharge status: Home / Self Care | Payer: No Typology Code available for payment source | Source: Ambulatory Visit | Attending: Obstetrics and Gynecology | Admitting: Obstetrics and Gynecology

## 2021-09-09 DIAGNOSIS — E041 Nontoxic single thyroid nodule: Secondary | ICD-10-CM

## 2021-11-05 ENCOUNTER — Other Ambulatory Visit: Payer: Self-pay | Admitting: Surgery

## 2021-11-05 ENCOUNTER — Other Ambulatory Visit: Payer: Self-pay

## 2021-11-05 DIAGNOSIS — E041 Nontoxic single thyroid nodule: Secondary | ICD-10-CM

## 2021-11-21 ENCOUNTER — Other Ambulatory Visit (HOSPITAL_COMMUNITY): Payer: Self-pay

## 2021-11-26 ENCOUNTER — Other Ambulatory Visit (HOSPITAL_COMMUNITY)
Admission: RE | Admit: 2021-11-26 | Discharge: 2021-11-26 | Disposition: A | Discharge status: Home / Self Care | Payer: No Typology Code available for payment source | Source: Ambulatory Visit | Attending: Radiology | Admitting: Radiology

## 2021-11-26 ENCOUNTER — Ambulatory Visit
Admission: RE | Admit: 2021-11-26 | Discharge: 2021-11-26 | Disposition: A | Discharge status: Home / Self Care | Payer: No Typology Code available for payment source | Source: Ambulatory Visit | Attending: Surgery | Admitting: Surgery

## 2021-11-26 DIAGNOSIS — E041 Nontoxic single thyroid nodule: Secondary | ICD-10-CM | POA: Diagnosis present

## 2021-11-26 DIAGNOSIS — E063 Autoimmune thyroiditis: Secondary | ICD-10-CM | POA: Insufficient documentation

## 2021-11-27 LAB — CYTOLOGY - NON PAP

## 2021-12-02 NOTE — Progress Notes (Signed)
Good news!  Thyroid FNA biopsy is benign.  This appears to be Hashimoto's thyroiditis, a benign condition, and the nodule may decrease in size on its own.  We will plan to see you back in one year with a repeat USN and TSH [name] for physical exam in my office.  tmg  Marissa [name] Level, MD [facility] practice Office: [phone]

## 2022-02-05 ENCOUNTER — Other Ambulatory Visit (HOSPITAL_COMMUNITY): Payer: Self-pay

## 2022-02-05 MED ORDER — LEVOTHYROXINE SODIUM 100 MCG PO TABS
100.0000 ug | ORAL_TABLET | Freq: Every morning | ORAL | 1 refills | Status: AC
  Filled 2022-02-05: qty 90, 90d supply, fill #0

## 2022-02-09 ENCOUNTER — Other Ambulatory Visit (HOSPITAL_COMMUNITY): Payer: Self-pay

## 2022-04-28 DIAGNOSIS — E559 Vitamin D deficiency, unspecified: Secondary | ICD-10-CM | POA: Diagnosis not present

## 2022-04-28 DIAGNOSIS — R3589 Other polyuria: Secondary | ICD-10-CM | POA: Diagnosis not present

## 2022-04-28 DIAGNOSIS — Z1389 Encounter for screening for other disorder: Secondary | ICD-10-CM | POA: Diagnosis not present

## 2022-04-28 DIAGNOSIS — E039 Hypothyroidism, unspecified: Secondary | ICD-10-CM | POA: Diagnosis not present

## 2022-04-28 DIAGNOSIS — E041 Nontoxic single thyroid nodule: Secondary | ICD-10-CM | POA: Diagnosis not present

## 2022-04-28 DIAGNOSIS — Z6829 Body mass index (BMI) 29.0-29.9, adult: Secondary | ICD-10-CM | POA: Diagnosis not present

## 2022-04-28 DIAGNOSIS — E8889 Other specified metabolic disorders: Secondary | ICD-10-CM | POA: Diagnosis not present

## 2022-04-28 DIAGNOSIS — E663 Overweight: Secondary | ICD-10-CM | POA: Diagnosis not present

## 2022-04-29 ENCOUNTER — Other Ambulatory Visit (HOSPITAL_COMMUNITY): Payer: Self-pay

## 2022-04-29 MED ORDER — ZEPBOUND 2.5 MG/0.5ML ~~LOC~~ SOAJ
2.5000 mg | SUBCUTANEOUS | 0 refills | Status: DC
  Filled 2022-04-29 – 2022-05-07 (×3): qty 2, 28d supply, fill #0

## 2022-05-07 ENCOUNTER — Other Ambulatory Visit: Payer: Self-pay

## 2022-05-07 ENCOUNTER — Other Ambulatory Visit (HOSPITAL_COMMUNITY): Payer: Self-pay

## 2022-05-07 DIAGNOSIS — E041 Nontoxic single thyroid nodule: Secondary | ICD-10-CM | POA: Diagnosis not present

## 2022-05-07 DIAGNOSIS — E039 Hypothyroidism, unspecified: Secondary | ICD-10-CM | POA: Diagnosis not present

## 2022-05-07 MED ORDER — SYNTHROID 112 MCG PO TABS
112.0000 ug | ORAL_TABLET | Freq: Every morning | ORAL | 2 refills | Status: DC
  Filled 2022-05-07: qty 30, 30d supply, fill #0
  Filled 2022-06-05: qty 30, 30d supply, fill #1
  Filled 2022-07-07: qty 30, 30d supply, fill #2

## 2022-05-08 ENCOUNTER — Other Ambulatory Visit (HOSPITAL_COMMUNITY): Payer: Self-pay

## 2022-05-12 DIAGNOSIS — Z6829 Body mass index (BMI) 29.0-29.9, adult: Secondary | ICD-10-CM | POA: Diagnosis not present

## 2022-05-12 DIAGNOSIS — E663 Overweight: Secondary | ICD-10-CM | POA: Diagnosis not present

## 2022-05-12 DIAGNOSIS — E559 Vitamin D deficiency, unspecified: Secondary | ICD-10-CM | POA: Diagnosis not present

## 2022-05-12 DIAGNOSIS — E039 Hypothyroidism, unspecified: Secondary | ICD-10-CM | POA: Diagnosis not present

## 2022-06-05 ENCOUNTER — Other Ambulatory Visit (HOSPITAL_COMMUNITY): Payer: Self-pay

## 2022-06-15 DIAGNOSIS — E559 Vitamin D deficiency, unspecified: Secondary | ICD-10-CM | POA: Diagnosis not present

## 2022-06-15 DIAGNOSIS — E039 Hypothyroidism, unspecified: Secondary | ICD-10-CM | POA: Diagnosis not present

## 2022-07-07 ENCOUNTER — Other Ambulatory Visit (HOSPITAL_COMMUNITY): Payer: Self-pay

## 2022-08-06 ENCOUNTER — Other Ambulatory Visit: Payer: Self-pay

## 2022-08-06 ENCOUNTER — Other Ambulatory Visit (HOSPITAL_COMMUNITY): Payer: Self-pay

## 2022-08-06 MED ORDER — SYNTHROID 112 MCG PO TABS
112.0000 ug | ORAL_TABLET | Freq: Every morning | ORAL | 2 refills | Status: AC
  Filled 2022-08-06: qty 30, 30d supply, fill #0
  Filled 2022-08-06: qty 90, 90d supply, fill #0

## 2022-08-07 ENCOUNTER — Other Ambulatory Visit (HOSPITAL_COMMUNITY): Payer: Self-pay

## 2022-10-22 ENCOUNTER — Other Ambulatory Visit: Payer: Self-pay | Admitting: Surgery

## 2022-10-22 DIAGNOSIS — E042 Nontoxic multinodular goiter: Secondary | ICD-10-CM

## 2022-10-22 DIAGNOSIS — E039 Hypothyroidism, unspecified: Secondary | ICD-10-CM

## 2022-10-30 ENCOUNTER — Other Ambulatory Visit (HOSPITAL_COMMUNITY): Payer: Self-pay

## 2022-10-30 DIAGNOSIS — M5388 Other specified dorsopathies, sacral and sacrococcygeal region: Secondary | ICD-10-CM | POA: Diagnosis not present

## 2022-10-30 DIAGNOSIS — E039 Hypothyroidism, unspecified: Secondary | ICD-10-CM | POA: Diagnosis not present

## 2022-10-30 DIAGNOSIS — Z0000 Encounter for general adult medical examination without abnormal findings: Secondary | ICD-10-CM | POA: Diagnosis not present

## 2022-10-30 DIAGNOSIS — E559 Vitamin D deficiency, unspecified: Secondary | ICD-10-CM | POA: Diagnosis not present

## 2022-10-30 DIAGNOSIS — L301 Dyshidrosis [pompholyx]: Secondary | ICD-10-CM | POA: Diagnosis not present

## 2022-10-30 DIAGNOSIS — E041 Nontoxic single thyroid nodule: Secondary | ICD-10-CM | POA: Diagnosis not present

## 2022-10-30 DIAGNOSIS — L509 Urticaria, unspecified: Secondary | ICD-10-CM | POA: Diagnosis not present

## 2022-10-30 DIAGNOSIS — Z13220 Encounter for screening for lipoid disorders: Secondary | ICD-10-CM | POA: Diagnosis not present

## 2022-10-30 DIAGNOSIS — F418 Other specified anxiety disorders: Secondary | ICD-10-CM | POA: Diagnosis not present

## 2022-10-30 DIAGNOSIS — J309 Allergic rhinitis, unspecified: Secondary | ICD-10-CM | POA: Diagnosis not present

## 2022-10-30 MED ORDER — SYNTHROID 100 MCG PO TABS
100.0000 ug | ORAL_TABLET | Freq: Every morning | ORAL | 2 refills | Status: DC
  Filled 2022-10-30: qty 30, 30d supply, fill #0

## 2022-10-30 MED ORDER — SYNTHROID 100 MCG PO TABS
100.0000 ug | ORAL_TABLET | Freq: Every morning | ORAL | 3 refills | Status: DC
  Filled 2022-10-30: qty 30, 30d supply, fill #0
  Filled 2022-12-03: qty 30, 30d supply, fill #1
  Filled 2023-01-05: qty 30, 30d supply, fill #2
  Filled 2023-02-04: qty 30, 30d supply, fill #3

## 2022-11-02 ENCOUNTER — Other Ambulatory Visit (HOSPITAL_COMMUNITY): Payer: Self-pay

## 2022-11-04 ENCOUNTER — Other Ambulatory Visit (HOSPITAL_COMMUNITY): Payer: Self-pay

## 2023-03-11 ENCOUNTER — Other Ambulatory Visit (HOSPITAL_COMMUNITY): Payer: Self-pay

## 2023-03-11 MED ORDER — SYNTHROID 100 MCG PO TABS
100.0000 ug | ORAL_TABLET | Freq: Every morning | ORAL | 3 refills | Status: DC
  Filled 2023-03-11: qty 30, 30d supply, fill #0
  Filled 2023-04-13: qty 30, 30d supply, fill #1
  Filled 2023-05-14: qty 30, 30d supply, fill #2
  Filled 2023-06-14: qty 30, 30d supply, fill #3

## 2023-05-14 ENCOUNTER — Other Ambulatory Visit (HOSPITAL_COMMUNITY): Payer: Self-pay

## 2023-06-21 DIAGNOSIS — G43909 Migraine, unspecified, not intractable, without status migrainosus: Secondary | ICD-10-CM | POA: Diagnosis not present

## 2023-06-21 DIAGNOSIS — E039 Hypothyroidism, unspecified: Secondary | ICD-10-CM | POA: Diagnosis not present

## 2023-06-21 DIAGNOSIS — R350 Frequency of micturition: Secondary | ICD-10-CM | POA: Diagnosis not present

## 2023-06-21 DIAGNOSIS — E559 Vitamin D deficiency, unspecified: Secondary | ICD-10-CM | POA: Diagnosis not present

## 2023-07-15 ENCOUNTER — Other Ambulatory Visit (HOSPITAL_COMMUNITY): Payer: Self-pay

## 2023-07-15 MED ORDER — SYNTHROID 100 MCG PO TABS
100.0000 ug | ORAL_TABLET | Freq: Every morning | ORAL | 3 refills | Status: DC
  Filled 2023-07-15: qty 30, 30d supply, fill #0
  Filled 2023-08-16: qty 30, 30d supply, fill #1
  Filled 2023-09-16: qty 30, 30d supply, fill #2
  Filled 2023-10-20: qty 30, 30d supply, fill #3

## 2023-08-09 DIAGNOSIS — Z1331 Encounter for screening for depression: Secondary | ICD-10-CM | POA: Diagnosis not present

## 2023-08-09 DIAGNOSIS — Z01419 Encounter for gynecological examination (general) (routine) without abnormal findings: Secondary | ICD-10-CM | POA: Diagnosis not present

## 2023-08-09 DIAGNOSIS — Z113 Encounter for screening for infections with a predominantly sexual mode of transmission: Secondary | ICD-10-CM | POA: Diagnosis not present

## 2023-08-09 DIAGNOSIS — Z1231 Encounter for screening mammogram for malignant neoplasm of breast: Secondary | ICD-10-CM | POA: Diagnosis not present

## 2023-08-09 DIAGNOSIS — Z01411 Encounter for gynecological examination (general) (routine) with abnormal findings: Secondary | ICD-10-CM | POA: Diagnosis not present

## 2023-08-09 DIAGNOSIS — Z124 Encounter for screening for malignant neoplasm of cervix: Secondary | ICD-10-CM | POA: Diagnosis not present

## 2023-10-07 ENCOUNTER — Other Ambulatory Visit (HOSPITAL_COMMUNITY): Payer: Self-pay

## 2023-10-07 DIAGNOSIS — N951 Menopausal and female climacteric states: Secondary | ICD-10-CM | POA: Diagnosis not present

## 2023-10-07 MED ORDER — PROGESTERONE MICRONIZED 100 MG PO CAPS
100.0000 mg | ORAL_CAPSULE | Freq: Every day | ORAL | 0 refills | Status: AC
  Filled 2023-10-07: qty 90, 90d supply, fill #0

## 2023-10-07 MED ORDER — ESTRADIOL 0.075 MG/24HR TD PTTW
1.0000 | MEDICATED_PATCH | TRANSDERMAL | 0 refills | Status: AC
  Filled 2023-10-07: qty 24, 84d supply, fill #0

## 2023-10-20 ENCOUNTER — Other Ambulatory Visit (HOSPITAL_COMMUNITY): Payer: Self-pay

## 2023-11-18 ENCOUNTER — Other Ambulatory Visit (HOSPITAL_COMMUNITY): Payer: Self-pay

## 2023-11-18 DIAGNOSIS — E559 Vitamin D deficiency, unspecified: Secondary | ICD-10-CM | POA: Diagnosis not present

## 2023-11-18 DIAGNOSIS — Z0000 Encounter for general adult medical examination without abnormal findings: Secondary | ICD-10-CM | POA: Diagnosis not present

## 2023-11-18 MED ORDER — SYNTHROID 100 MCG PO TABS
100.0000 ug | ORAL_TABLET | Freq: Every morning | ORAL | 4 refills | Status: AC
  Filled 2023-11-18: qty 90, 90d supply, fill #0
  Filled 2024-02-21: qty 90, 90d supply, fill #1
  Filled 2024-05-17: qty 90, 90d supply, fill #2
  Filled 2024-05-19: qty 90, 90d supply, fill #0

## 2023-12-11 ENCOUNTER — Telehealth

## 2024-02-21 ENCOUNTER — Other Ambulatory Visit (HOSPITAL_COMMUNITY): Payer: Self-pay

## 2024-05-08 ENCOUNTER — Telehealth: Admitting: Physician Assistant

## 2024-05-08 ENCOUNTER — Other Ambulatory Visit (HOSPITAL_COMMUNITY): Payer: Self-pay

## 2024-05-08 DIAGNOSIS — H5712 Ocular pain, left eye: Secondary | ICD-10-CM | POA: Diagnosis not present

## 2024-05-08 DIAGNOSIS — H00015 Hordeolum externum left lower eyelid: Secondary | ICD-10-CM | POA: Diagnosis not present

## 2024-05-08 MED ORDER — ERYTHROMYCIN 5 MG/GM OP OINT
1.0000 | TOPICAL_OINTMENT | Freq: Every day | OPHTHALMIC | 0 refills | Status: DC
  Filled 2024-05-08: qty 3.5, 5d supply, fill #0

## 2024-05-08 MED ORDER — ERYTHROMYCIN 5 MG/GM OP OINT: 1.0000 | TOPICAL_OINTMENT | Freq: Every day | OPHTHALMIC | 0 refills | Status: AC

## 2024-05-08 NOTE — Addendum Note (Signed)
 Addended by: VIVIENNE FORTUNATO HERO on: 05/08/2024 02:58 PM   Modules accepted: Orders

## 2024-05-08 NOTE — Progress Notes (Signed)
 We are sorry that you are not feeling well. Here is how we plan to help!  Based on what you have shared with me it looks like you have a stye.  A stye is an inflammation of the eyelid.  It is often a red, painful lump near the edge of the eyelid that may look like a boil or a pimple.  A stye develops when an infection occurs at the base of an eyelash.   We have made appropriate suggestions for you based upon your presentation: Simple styes can be treated without medical intervention.  Most styes either resolve spontaneously or resolve with simple home treatment by applying warm compresses or heated washcloth to the stye for about 10-15 minutes three to four times a day. This causes the stye to drain and resolve. and I have prescribed Erythromycin Ophthalmic ointment 0.5% Apply topically in affected eye daily at bedtime for 5 days. To apply: Tilt the head back and, pressing your finger gently on the skin just beneath the lower eyelid, pull the lower eyelid away from the eye to make a space. Squeeze a thin strip of ointment into this space. A 1-cm (approximately 1/3-inch) strip of ointment is usually enough, unless you have been told by your doctor to use a different amount. Let go of the eyelid and gently close the eyes. Keep the eyes closed for 1 or 2 minutes to allow the medicine to come into contact with the infection.      HOME CARE:  Wash your hands often! Let the stye open on its own. Don't squeeze or open it. Don't rub your eyes. This can irritate your eyes and let in bacteria.  If you need to touch your eyes, wash your hands first. Don't wear eye makeup or contact lenses until the area has healed.  GET HELP RIGHT AWAY IF:  Your symptoms do not improve. You develop blurred or loss of vision. Your symptoms worsen (increased discharge, pain or redness).  Thank you for choosing an e-visit.  Your e-visit answers were reviewed by a board certified advanced clinical practitioner to complete  your personal care plan.  Depending upon the condition, your plan could have included both over the counter or prescription medications.  Please review your pharmacy choice.  Make sure the pharmacy is open so you can pick up prescription now.  If there is a problem, you may contact your provider through [employer] and have the prescription routed to another pharmacy.    Your safety is important to us .  If you have drug allergies check your prescription carefully.  For the next 24 hours you can use MyChart to ask questions about today's visit, request a non-urgent call back, or ask for a work or school excuse.  You will get an email in the next two days asking about your experience.  I hope you that your e-visit has been valuable and will speed your recovery.  I have spent 5 minutes in review of e-visit questionnaire, review and updating patient chart, medical decision making and response to patient.   Delon CHRISTELLA Dickinson, PA-C

## 2024-05-08 NOTE — Progress Notes (Signed)
 Message sent to patient requesting further input regarding current symptoms. Awaiting patient response.

## 2024-05-17 ENCOUNTER — Other Ambulatory Visit (HOSPITAL_COMMUNITY): Payer: Self-pay

## 2024-05-17 ENCOUNTER — Encounter (HOSPITAL_COMMUNITY): Payer: Self-pay

## 2024-05-19 ENCOUNTER — Other Ambulatory Visit (HOSPITAL_COMMUNITY): Payer: Self-pay
